# Patient Record
Sex: Male | Born: 1965 | Race: White | Hispanic: No | Marital: Married | Smoking: Current every day smoker
Health system: Southern US, Community
[De-identification: ages and names within clinical notes are randomized; demographics above are authoritative.]

## PROBLEM LIST (undated history)

## (undated) DIAGNOSIS — M25519 Pain in unspecified shoulder: Secondary | ICD-10-CM

## (undated) HISTORY — PX: KNEE SURGERY: SHX244

---

## 2000-04-03 ENCOUNTER — Ambulatory Visit (HOSPITAL_BASED_OUTPATIENT_CLINIC_OR_DEPARTMENT_OTHER): Admission: RE | Admit: 2000-04-03 | Discharge: 2000-04-03 | Payer: Self-pay | Admitting: Orthopedic Surgery

## 2005-12-15 ENCOUNTER — Emergency Department (HOSPITAL_COMMUNITY): Admission: EM | Admit: 2005-12-15 | Discharge: 2005-12-15 | Payer: Self-pay | Admitting: Emergency Medicine

## 2006-05-18 ENCOUNTER — Emergency Department: Payer: Self-pay

## 2009-03-09 ENCOUNTER — Ambulatory Visit: Payer: Self-pay | Admitting: Family Medicine

## 2009-06-28 ENCOUNTER — Ambulatory Visit: Payer: Self-pay | Admitting: Internal Medicine

## 2010-06-06 ENCOUNTER — Ambulatory Visit: Payer: Self-pay | Admitting: Internal Medicine

## 2011-06-03 ENCOUNTER — Ambulatory Visit: Payer: Self-pay

## 2012-02-25 ENCOUNTER — Ambulatory Visit: Payer: Self-pay

## 2012-02-25 ENCOUNTER — Observation Stay: Payer: Self-pay | Admitting: Internal Medicine

## 2012-02-25 LAB — CK TOTAL AND CKMB (NOT AT ARMC): CK-MB: 0.8 ng/mL (ref 0.5–3.6)

## 2012-02-25 LAB — URINALYSIS, COMPLETE
Blood: NEGATIVE
Ketone: NEGATIVE
Nitrite: NEGATIVE
Ph: 9 (ref 4.5–8.0)
Protein: NEGATIVE
Specific Gravity: 1.005 (ref 1.003–1.030)

## 2012-02-25 LAB — COMPREHENSIVE METABOLIC PANEL
Alkaline Phosphatase: 71 U/L (ref 50–136)
Calcium, Total: 8.5 mg/dL (ref 8.5–10.1)
Chloride: 112 mmol/L — ABNORMAL HIGH (ref 98–107)
Co2: 25 mmol/L (ref 21–32)
EGFR (Non-African Amer.): >60
Osmolality: 287 (ref 275–301)
Sodium: 145 mmol/L (ref 136–145)

## 2012-02-25 LAB — TROPONIN I
Troponin-I: <0.02 ng/mL
Troponin-I: <0.02 ng/mL

## 2012-02-25 LAB — CBC
HGB: 14.3 g/dL (ref 13.0–18.0)
MCV: 88 fL (ref 80–100)
Platelet: 200 10*3/uL (ref 150–440)
RBC: 4.63 10*6/uL (ref 4.40–5.90)
WBC: 4.9 10*3/uL (ref 3.8–10.6)

## 2012-02-26 LAB — TROPONIN I: Troponin-I: <0.02 ng/mL

## 2012-02-26 LAB — LIPID PANEL: HDL Cholesterol: 23 mg/dL — ABNORMAL LOW (ref 40–60)

## 2012-02-26 LAB — CBC WITH DIFFERENTIAL/PLATELET
Basophil #: 0 10*3/uL (ref 0.0–0.1)
Lymphocyte #: 2.7 10*3/uL (ref 1.0–3.6)
MCV: 89 fL (ref 80–100)
Monocyte %: 7.5 %
Platelet: 206 10*3/uL (ref 150–440)
RDW: 13.7 % (ref 11.5–14.5)
WBC: 7.2 10*3/uL (ref 3.8–10.6)

## 2012-04-21 ENCOUNTER — Ambulatory Visit: Payer: Self-pay | Admitting: General Practice

## 2013-05-21 IMAGING — CR DG CHEST 2V
1 series · 2 of 2 positions shown · non-contrast
Comparison: none

REASON FOR EXAM: chest pain
COMMENTS:   May transport without cardiac monitor

PROCEDURE:     DXR - DXR CHEST PA (OR AP) AND LATERAL  - February 25, 2012 [DATE]
RESULT:     Comparison: 06/06/2010

[Series 1: w chest pa · 0.14mm/px · 2 of 2 slices shown]
[im 1/2]
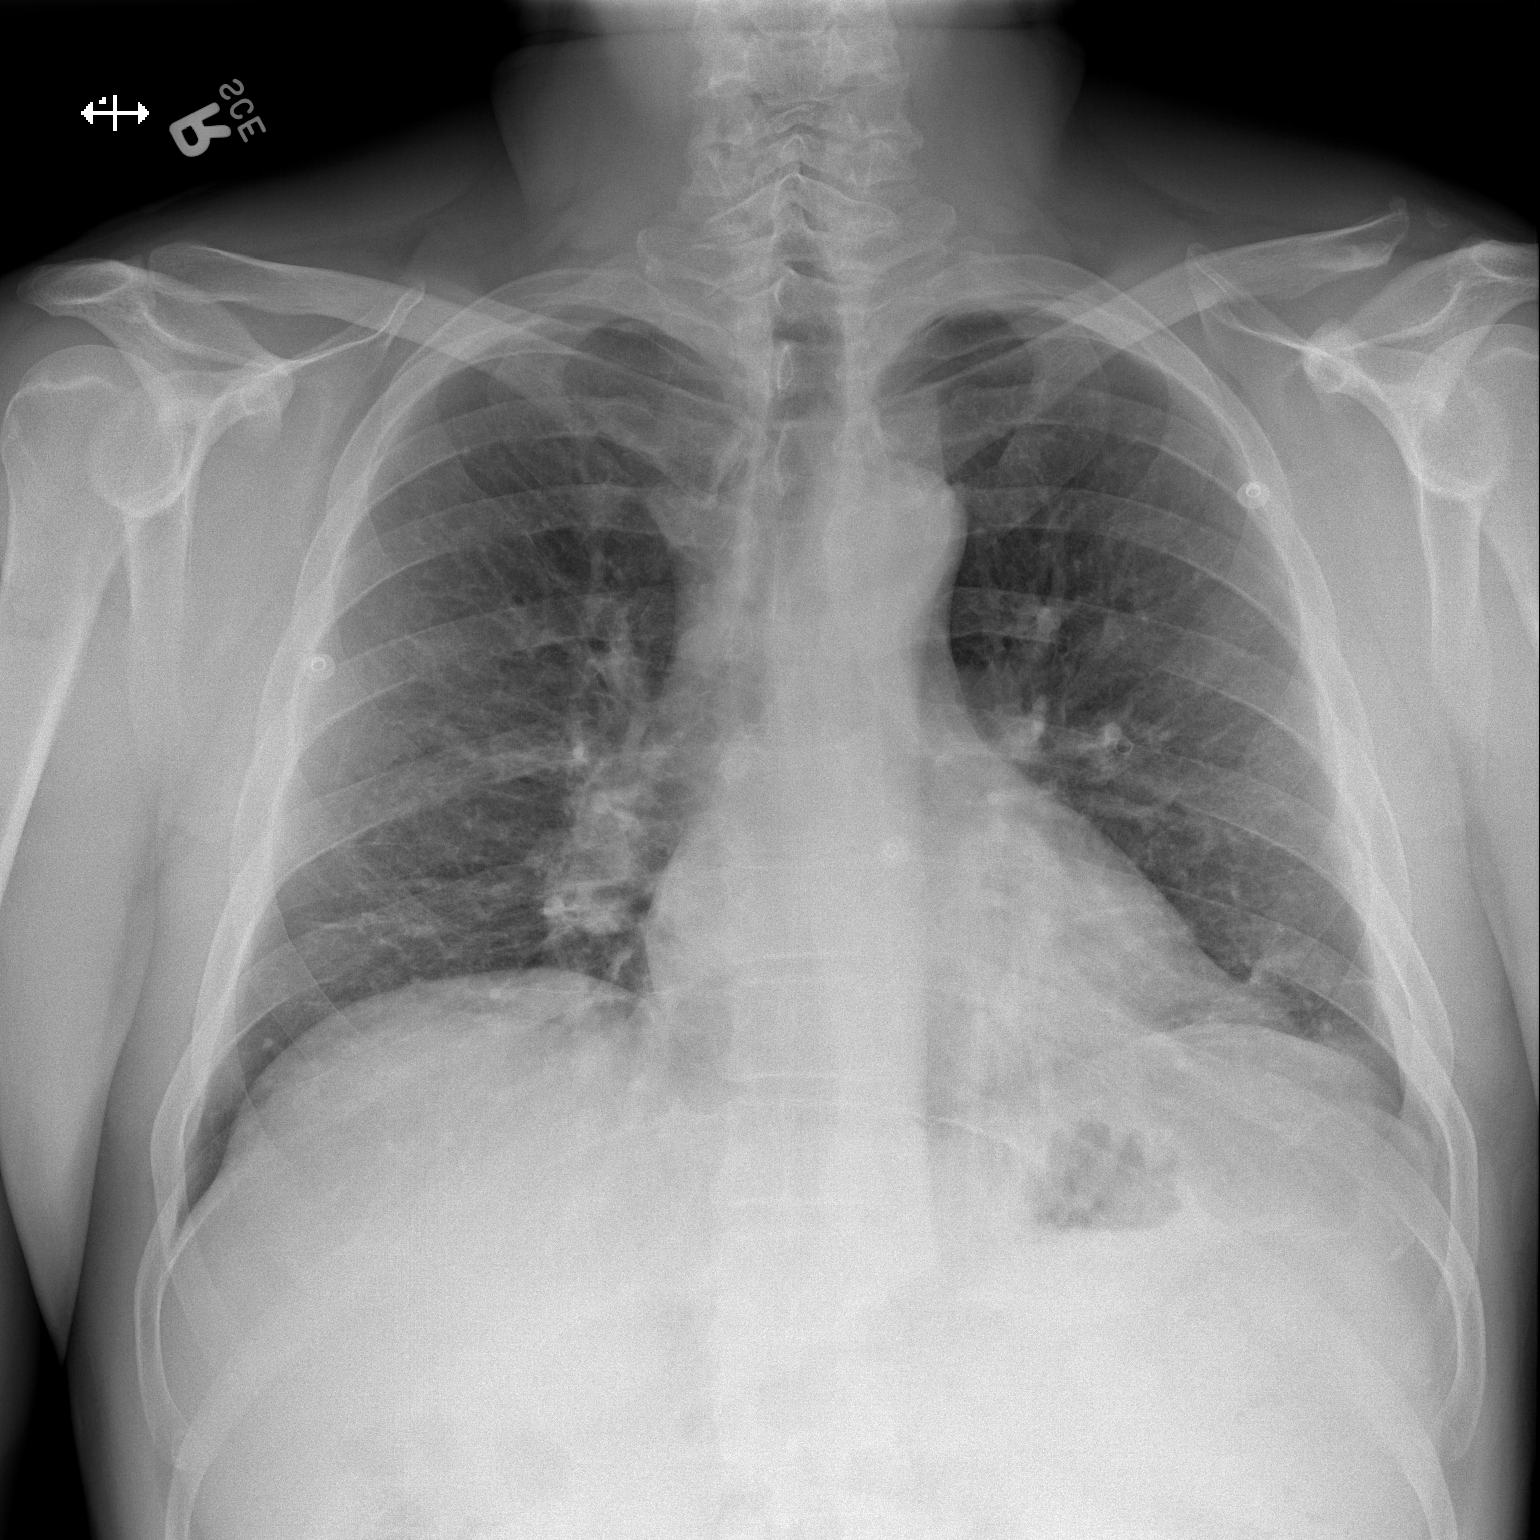
[im 2/2]
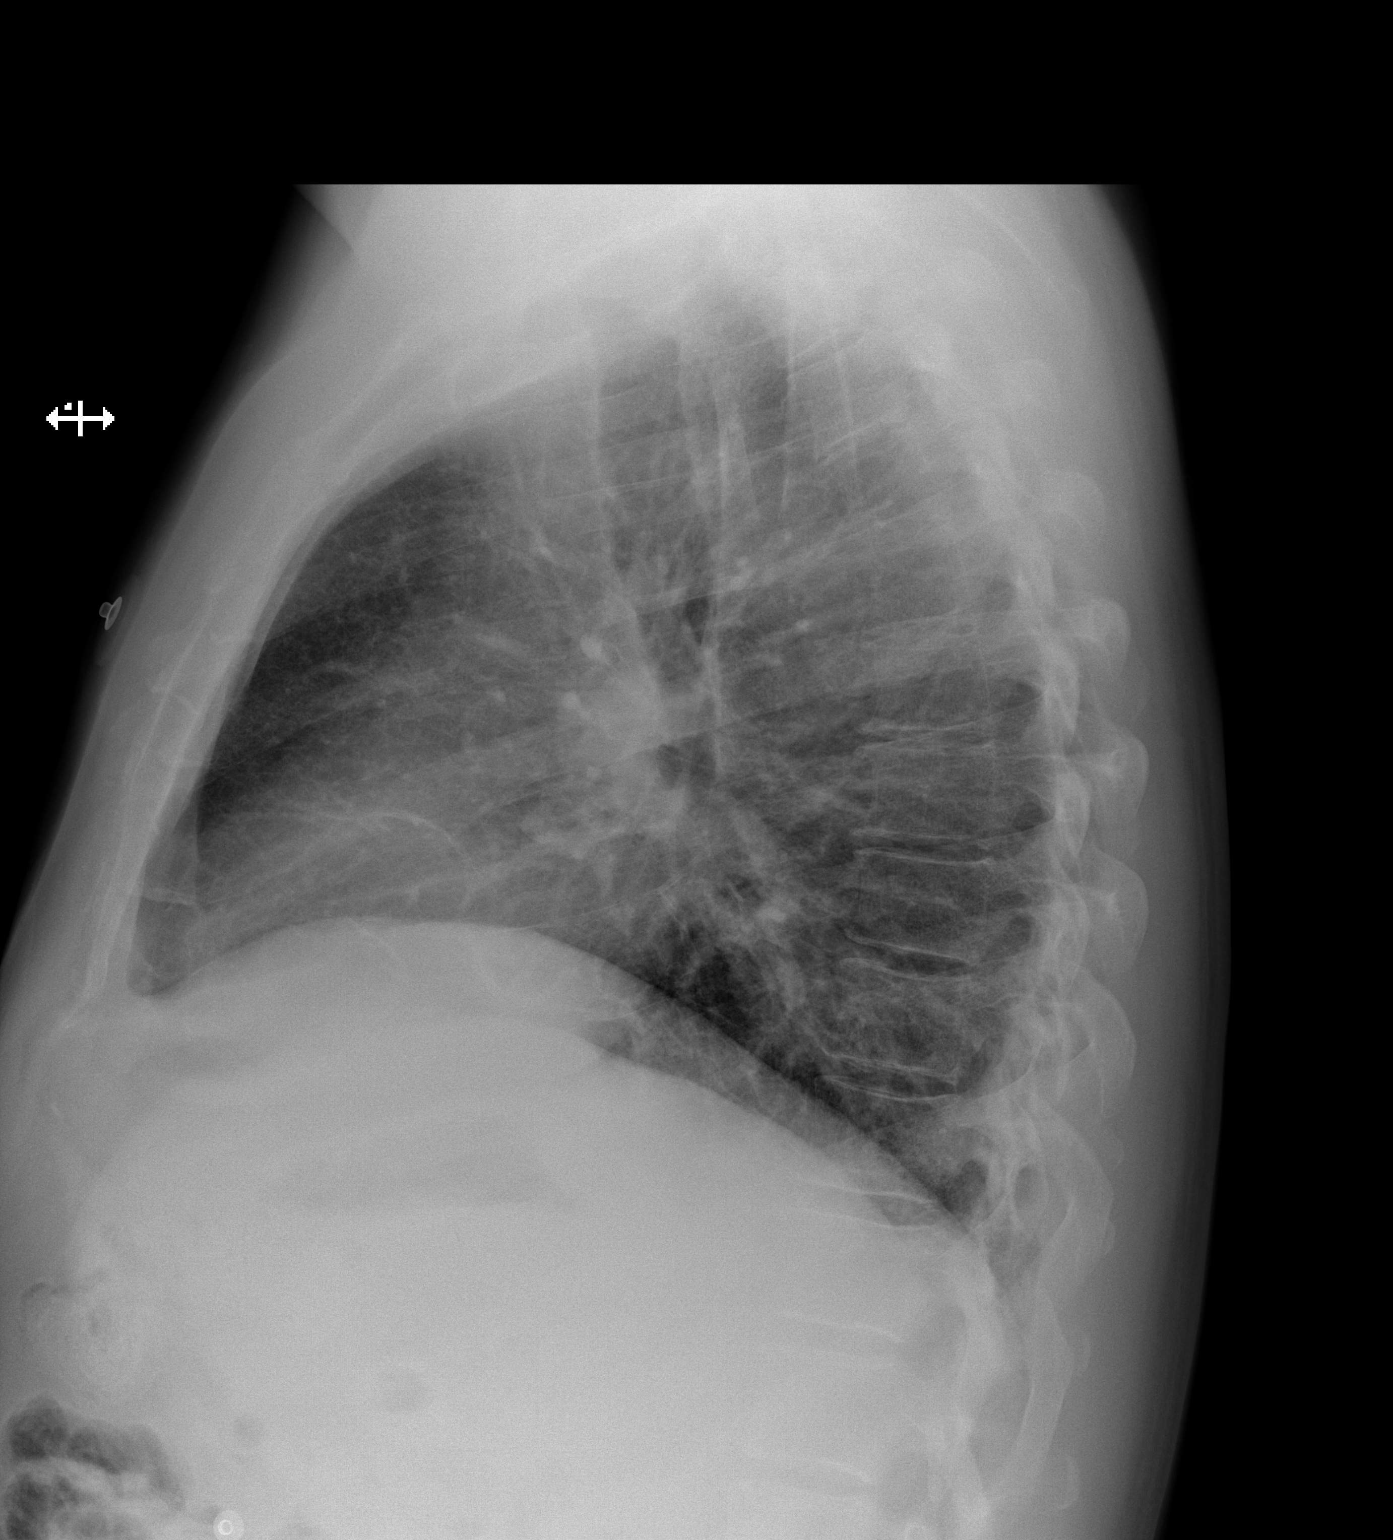

[2 of 2 positions shown; findings below may reference images not displayed]

FINDINGS: The heart and mediastinum are stable, given slightly diminished lung
volumes. Mild prominence of the right hilum is likely secondary to the low
lung volumes and vascular crowding. Mild left basilar opacities are likely
secondary to atelectasis.
IMPRESSION: 1. Mild left basilar opacities are likely secondary to atelectasis. However,
followup chest radiograph is recommended to ensure resolution.
2. Mild prominence of the right hilum is likely secondary to vascular
crowding from the low lung volumes. However, recommend attention on the
aforementioned followup chest radiograph and with improved inspiration.

[REDACTED]

## 2014-09-07 NOTE — Discharge Summary (Signed)
PATIENT NAME:  Kenneth AbideACOUCCI, Nainoa A MR#:  [id number] DATE OF BIRTH:  1965/09/20  DATE OF ADMISSION:  02/25/2012 DATE OF DISCHARGE:  02/26/2012  DISCHARGE DIAGNOSES:  1. Atypical chest pain, unclear etiology.  2. Hyperlipidemia.  3. Smoking.   DISPOSITION: The patient is being discharged home. Follow-up with primary care physician, Dr. Arlana Pouchate, in 1 to 2 weeks after discharge.   DIET: Low-fat, low-salt, low-cholesterol.  DISCHARGE MEDICATIONS: None.  RESULTS: The patient's stress test showed no evidence of ischemia, normal LV function.   Cardiac enzymes negative. CBC normal. VLDL 39, LDL 102, triglycerides 197, and HDL 23. Hemoglobin A1c 5.4.   HOSPITAL COURSE: The patient is a 49 year old male with history of smoking and hyperlipidemia, not on any medication, who presented with chest pain. Given his risk factors, he was admitted to the hospital and ruled out for acute coronary syndrome by checking serial cardiac enzymes, which were negative. The etiology of his chest pain was unclear, but his chest pain resolved spontaneously. He underwent an inpatient stress test which showed no evidence of ischemia. His LDL is 102. He was advised to diet/lifestyle modifications. He will address this with his PCP and start on treatment if needed. He was extensively counseled about smoking cessation. He is being discharged home in a stable condition.   TIME SPENT: 45 minutes.  ____________________________ Darrick MeigsSangeeta Shanora Christensen, MD sp:slb D: 02/26/2012 15:24:35 ET T: 02/27/2012 10:01:26 ET JOB#: [id number]  cc: Darrick MeigsSangeeta Dorinne Graeff, MD, <Dictator> Jillene Bucksenny C. Arlana Pouchate, MD Darrick MeigsSANGEETA Duana Benedict MD ELECTRONICALLY SIGNED 02/27/2012 14:18

## 2014-09-07 NOTE — Consult Note (Signed)
General Aspect 49 yo male with acute onset chest pain with associated dyspnea, diaphoresis this am relieved with  3 nitroglycerin. Ekg wtihout acute changes and cardiac enzymes normal. Small amount of pain early this pm with relief with one ntg. H/O of tobacco abuse x 33 years and hyperlidemia but not bad enough for medical management per pt. Now comfortable without complaints. Pt has also recently noted some fatigue with playing Paintball that never bothered him in past. Scheduled for stress test in am. No meds at home and no significant Past medical history other than stated above.No family h/o CAD. Bradycardia on monitor which is new for pt.   Physical Exam:   GEN well developed, no acute distress    HEENT pink conjunctivae    NECK supple    RESP normal resp effort  clear BS    CARD Bradycardic    ABD denies tenderness  normal BS    EXTR negative edema    SKIN normal to palpation    NEURO cranial nerves intact, motor/sensory function intact    PSYCH alert, A+O to time, place, person   Review of Systems:   Subjective/Chief Complaint Chest Pain    General: Fatigue    Skin: No Complaints    ENT: No Complaints    Eyes: No Complaints    Neck: No Complaints    Respiratory: Short of breath  this am    Cardiovascular: Chest pain or discomfort  this am    Gastrointestinal: No Complaints    Genitourinary: No Complaints    Vascular: No Complaints    Musculoskeletal: No Complaints    Neurologic: No Complaints    Hematologic: No Complaints    Endocrine: No Complaints    Psychiatric: No Complaints    Medications/Allergies Reviewed Medications/Allergies reviewed   Lab Results: Hepatic:  07-Oct-13 10:46    Bilirubin, Total 0.6   Alkaline Phosphatase 71   SGPT (ALT) 24   SGOT (AST) 25   Total Protein, Serum 6.9   Albumin, Serum 3.9  Routine Chem:  07-Oct-13 10:46    Glucose, Serum 83   BUN 11   Creatinine (comp) 1.06   Sodium, Serum 145   Potassium,  Serum 3.7   Chloride, Serum  112   CO2, Serum 25   Calcium (Total), Serum 8.5   Osmolality (calc) 287   eGFR (African American) >60   eGFR (Non-African American) >60 (eGFR values <33m/min/1.73 m2 may be an indication of chronic kidney disease (CKD). Calculated eGFR is useful in patients with stable renal function. The eGFR calculation will not be reliable in acutely ill patients when serum creatinine is changing rapidly. It is not useful in  patients on dialysis. The eGFR calculation may not be applicable to patients at the low and high extremes of body sizes, pregnant women, and vegetarians.)   Anion Gap 8  Cardiac:  07-Oct-13 10:46    CK, Total 131   CPK-MB, Serum 0.9 (Result(s) reported on 25 Feb 2012 at 11:37AM.)   Troponin I < 0.02 (0.00-0.05 0.05 ng/mL or less: NEGATIVE  Repeat testing in 3-6 hrs  if clinically indicated. >0.05 ng/mL: POTENTIAL  MYOCARDIAL INJURY. Repeat  testing in 3-6 hrs if  clinically indicated. NOTE: An increase or decrease  of 30% or more on serial  testing suggests a  clinically important change)   Radiology Results: XRay:    07-Oct-13 11:15, Chest PA and Lateral   Chest PA and Lateral    REASON FOR EXAM:  chest pain  COMMENTS:   May transport without cardiac monitor    PROCEDURE: DXR - DXR CHEST PA (OR AP) AND LATERAL  - Feb 25 2012 11:15AM     RESULT: Comparison: 06/06/2010    Findings:  The heart and mediastinum are stable, given slightly diminished lung   volumes. Mild prominence of the right hilum is likely secondary to the   low lung volumes and vascular crowding. Mild left basilar opacities are   likely secondary to atelectasis.    IMPRESSION:   1. Mild left basilar opacities are likely secondary to atelectasis.     However, followup chest radiograph is recommended to ensure resolution.  2. Mild prominence of the right hilum is likely secondary to vascular   crowding from the low lung volumes. However, recommend attention on  the   aforementioned followup chest radiograph and with improved inspiration.    Dictation site: 2          Verified By: Gregor Hams, M.D., MD    Bee Stings: Anaphylaxis  Vital Signs/Nurse's Notes: **Vital Signs.:   07-Oct-13 15:18   Temperature Temperature (F) 97.8   Celsius 36.5   Temperature Source Oral   Pulse Pulse 50   Respirations Respirations 18   Systolic BP Systolic BP 388   Diastolic BP (mmHg) Diastolic BP (mmHg) 77   Mean BP 90   Pulse Ox % Pulse Ox % 95   Pulse Ox Activity Level  At rest   Oxygen Delivery Room Air/ 21 %     Impression 49 yo male with chest pain, diaphoresis, dyspnea with negative EKG changes and first troponin negative. Scheduled for stress test in am but if other serial enzymes are showing any elevation, would consider cancelling stress test and going straight to cardiac catherization.   Electronic Signatures: Roderic Palau (NP)  (Signed 07-Oct-13 17:25)  Authored: General Aspect/Present Illness, History and Physical Exam, Review of System, Labs, Radiology, Allergies, Vital Signs/Nurse's Notes, Impression/Plan   Last Updated: 07-Oct-13 17:25 by Roderic Palau (NP)

## 2014-09-07 NOTE — Op Note (Signed)
PATIENT NAME:  Kenneth Ho, [name] MR#:  [id number] DATE OF BIRTH:  June 16, 1965  DATE OF PROCEDURE:  04/21/2012  PREOPERATIVE DIAGNOSIS: Left carpal tunnel syndrome.   POSTOPERATIVE DIAGNOSIS: Left carpal tunnel syndrome.   PROCEDURE PERFORMED: Left carpal tunnel release.   SURGEON: Illene LabradorJames P. Angie FavaHooten Jr., MD  ANESTHESIA: General.   ESTIMATED BLOOD LOSS: Minimal.   TOURNIQUET TIME: 29 minutes.   DRAINS: None.  ESTIMATED BLOOD LOSS: Minimal.   INDICATIONS FOR SURGERY: The patient is a 49 year old right-hand dominant male who had approximately one year history of progressive numbness and paresthesias to the left hand. Previous EMG nerve conduction studies demonstrated findings consistent with left carpal tunnel syndrome that was felt to be moderately severe. He did not see any significant improvement despite splinting and activity modification. After discussion of the risks and benefits of surgical intervention, the patient expressed understanding of the risks and benefits and agreed with plans for surgical intervention.   PROCEDURE IN DETAIL: Patient was brought into the Operating Room and, after adequate general anesthesia was achieved, a tourniquet was placed on the patient's upper left arm. Patient's left hand and arm were cleaned and prepped with alcohol and DuraPrep, draped in the usual sterile fashion. A "timeout" was performed as per usual protocol. The right upper extremity was exsanguinated using Esmarch, and tourniquet was inflated to 250 mmHg. Loupe magnification was used throughout the procedure. Curvilinear incision was made just ulnar to the thenar palmar crease. Dissection was carried down through the palmar fascia to the transverse carpal ligament. Transverse carpal ligament was sharply incised, taking care to protect the underlying structures within the carpal tunnel. Complete release of the transverse carpal ligament was achieved. There was evidence of flattening of the median nerve  under the extent of the transverse carpal ligament. No evidence of lipoma or ganglion cyst formation. The wound was irrigated with copious amounts of normal saline with antibiotic solution. The skin was reapproximated using interrupted sutures of 5-0 nylon. 10 mL of 0.25% Marcaine was injected along the incision site. A sterile dressing was applied followed by application of volar wrist splint. Tourniquet was deflated after total tourniquet time of 29 minutes.   Patient tolerated the procedure well. He was transported to the recovery room in stable condition.    ____________________________ Illene LabradorJames P. Angie FavaHooten Jr., MD jph:cms D: 04/21/2012 21:36:00 ET T: 04/22/2012 10:52:38 ET JOB#: [id number]  cc: Fayrene FearingJames P. Angie FavaHooten Jr., MD, <Dictator> JAMES P Angie FavaHOOTEN JR MD ELECTRONICALLY SIGNED 04/22/2012 21:14

## 2014-09-07 NOTE — H&P (Signed)
PATIENT NAME:  Kenneth Ho, Kenneth Ho MR#:  [id number] DATE OF BIRTH:  [birth year]  DATE OF ADMISSION:  02/25/2012  CHIEF COMPLAINT: Chest pain and pressure.   PRIMARY CARE PHYSICIAN: Dewaine Oats, MD  ER PHYSICIAN: Daryel November, MD  HISTORY OF PRESENT ILLNESS: This is a 49 year old male with history of hyperlipidemia, not taking any medication, brought by the wife. The patient was sent from [facility]. The patient had chest pain this morning and had shortness of breath. The patient started to have chest pain around 6:30, in the middle of the chest, radiating to the left arm, 10/10 in severity.  The patient went to work, but because of persistent chest pain with trouble breathing, the patient was taken to [facility] by his boss. The patient's chest pain was relieved with nitroglycerin in urgent care. He also got aspirin. He was sent here for further evaluation. The patient's chest pain was significant with heaviness, shortness of breath, and some nausea and sweating. It was stabbing type of pain relieved with nitroglycerin. The patient was evaluated here. The patient is referred for admission.   PAST MEDICAL HISTORY: No hypertension, but his wife says he has hyperlipidemia, but not on medicine.   ALLERGIES: Bee stings.   SOCIAL HISTORY: Heavy smoker before, now cut down to one pack every two days and using electronic cigarettes. Occasional alcohol but no drugs. The patient works at [employer] as a Merchandiser, retail.   MEDICATIONS: None.   FAMILY HISTORY: Significant for diabetes in his father.  PAST SURGICAL HISTORY:  1. Appendectomy. 2. Carpal tunnel release surgery. 3. Both knee surgery.  REVIEW OF SYSTEMS: CONSTITUTIONAL: Denies fever. No fatigue. EYES: No blurred vision. ENT: No tinnitus. No epistaxis. No difficulty swallowing. RESPIRATORY: Shortness of breath and chest pain today. CARDIOVASCULAR: Chest pain today with some heaviness in the chest and shortness of breath and nausea.  Symptoms were relieved with nitroglycerin. GASTROINTESTINAL: No abdominal pain. No hematemesis. No constipation or diarrhea. GENITOURINARY: No dysuria. INTEGUMENTARY: No skin rashes. MUSCULOSKELETAL: No joint pain. NEUROLOGIC: No numbness or weakness. No transient ischemic attacks. PSYCH: No anxiety or insomnia.   PHYSICAL EXAMINATION:   GENERAL: A moderately obese male, not in distress, answering questions appropriately. Alert, awake, and oriented.   VITAL SIGNS: Temperature 98.2, pulse 52, respirations 18, blood pressure 108/71, and saturation 94% on room air.   HEAD/EYES: Head atraumatic, normocephalic. Pupils equally reacting to light. Extraocular movements intact.   ENT: No tympanic membrane congestion. No turbinate hypertrophy. The patient has no tonsillar exudate. No oropharyngeal erythema.   NECK: Normal range of motion. No JVD. No carotid bruit.   CARDIOVASCULAR: S1 and S2 regular. No murmurs.   PULMONARY: Clear to auscultation. No wheeze. No rales.   ABDOMEN: Soft. Bowel sounds present. No organomegaly.   EXTREMITIES: No extremity edema. No cyanosis. No clubbing.   NEUROLOGIC: Alert, awake, and oriented. Cranial nerves II through XII are intact. Power 5/5 in upper and lower extremities. Sensation intact. Deep tendon reflexes 2+ bilaterally.   PSYCH: Mood and affect are within normal limits.   LABS/RADIOLOGIC STUDIES: Chest x-ray: Mild left basilar opacity likely secondary to atelectasis. Mild prominence in the right hilum likely secondary to vascular crowding from low lung volumes. Recommend attention on the aforementioned followup chest radiograph with improved inspiration.   Electrolytes: Sodium 145, potassium 3.7, chloride 112, bicarbonate 25, BUN 11, creatinine 1.06, and glucose 83. CK total 131. CPK-MB 0.9. CBC: WBC 4.9, hemoglobin 14.3, hematocrit 40.7, and platelets 200. Troponin less than  0.02.  Urinalysis: Straw-colored, less than 1 bacteria.   EKG: Sinus  bradycardia with no ST-T changes, 54 beats per minute.   ASSESSMENT AND PLAN:  1. The patient is a 49 year old male with chest pain. Symptoms are concerning for angina. The patient had chest pain relieved with nitroglycerin given in urgent care, also in the ER. So the patient will be admitted for overnight observation for unstable angina. Continue to monitor him on telemetry, continue aspirin, nitroglycerin and also Lovenox and statins. Beta blockers cannot be given because he is having bradycardia, heart rate is around 50s.  Dr. Darrold JunkerParaschos already saw the patient. The patient will be having stress test tomorrow. Obtain fasting lipase also as well. Continue to cycle cardiac markers.  2. Tobacco abuse. The patient has atelectasis on the left side. We will repeat another x-ray tomorrow to make sure it is resolving. Also counseled about smoking cessation for three minutes. The patient's wife says he cut down a lot, from two packs a day to almost one pack every two days and using electronic cigarettes so he is on the way to quitting and appreciate that.  TIME SPENT: About 55 minutes. ____________________________ Katha HammingSnehalatha Tyniya Kuyper, MD sk:slb D: 02/25/2012 13:14:00 ET     T: 02/25/2012 14:02:51 ET        JOB#: [id number] cc: Katha HammingSnehalatha Samuel Rittenhouse, MD, <Dictator> Jillene Bucksenny C. Arlana Pouchate, MD Katha HammingSNEHALATHA Garrie Woodin MD ELECTRONICALLY SIGNED 03/12/2012 13:53

## 2018-12-11 ENCOUNTER — Other Ambulatory Visit: Payer: Self-pay | Admitting: Physician Assistant

## 2018-12-11 DIAGNOSIS — M50123 Cervical disc disorder at C6-C7 level with radiculopathy: Secondary | ICD-10-CM

## 2019-03-23 ENCOUNTER — Other Ambulatory Visit: Payer: Self-pay | Admitting: Surgery

## 2019-03-27 ENCOUNTER — Other Ambulatory Visit: Payer: Self-pay

## 2019-03-27 ENCOUNTER — Encounter
Admission: RE | Admit: 2019-03-27 | Discharge: 2019-03-27 | Disposition: A | Discharge status: Home / Self Care | Payer: BC Managed Care – PPO | Source: Ambulatory Visit | Attending: Surgery | Admitting: Surgery

## 2019-03-27 HISTORY — DX: Pain in unspecified shoulder: M25.519

## 2019-03-27 NOTE — Patient Instructions (Signed)
Your procedure is scheduled on: 04/02/2019 Thurs Report to Same Day Surgery 2nd floor medical mall [facility] Entrance-take elevator on left to 2nd floor.  Check in with surgery information desk.) To find out your arrival time please call [phone] between 1PM - 3PM on 04/01/2019 Wed  Remember: Instructions that are not followed completely may result in serious medical risk, up to and including death, or upon the discretion of your surgeon and anesthesiologist your surgery may need to be rescheduled.    _x___ 1. Do not eat food after midnight the night before your procedure. You may drink clear liquids up to 2 hours before you are scheduled to arrive at the hospital for your procedure.  Do not drink clear liquids within 2 hours of your scheduled arrival to the hospital.  Clear liquids include  --Water or Apple juice without pulp  --Clear carbohydrate beverage such as ClearFast or Gatorade  --Black Coffee or Clear Tea (No milk, no creamers, do not add anything to                  the coffee or Tea Type 1 and type 2 diabetics should only drink water.   ____Ensure clear carbohydrate drink on the way to the hospital for bariatric patients  _x___Ensure clear carbohydrate drink 3 hours before surgery. Complete drink 2 hours before coming to the hospital   No gum chewing or hard candies.     __x__ 2. No Alcohol for 24 hours before or after surgery.   __x__3. No Smoking or e-cigarettes for 24 prior to surgery.  Do not use any chewable tobacco products for at least 6 hour prior to surgery   ____  4. Bring all medications with you on the day of surgery if instructed.    __x__ 5. Notify your doctor if there is any change in your medical condition     (cold, fever, infections).    x___6. On the morning of surgery brush your teeth with toothpaste and water.  You may rinse your mouth with mouth wash if you wish.  Do not swallow any toothpaste or mouthwash.   Do not wear jewelry, make-up,  hairpins, clips or nail polish.  Do not wear lotions, powders, or perfumes. You may wear deodorant.  Do not shave 48 hours prior to surgery. Men may shave face and neck.  Do not bring valuables to the hospital.    [facility] is not responsible for any belongings or valuables.               Contacts, dentures or bridgework may not be worn into surgery.  Leave your suitcase in the car. After surgery it may be brought to your room.  For patients admitted to the hospital, discharge time is determined by your                       treatment team.  _  Patients discharged the day of surgery will not be allowed to drive home.  You will need someone to drive you home and stay with you the night of your procedure.    Please read over the following fact sheets that you were given:   [facility] Preparing for Surgery and or MRSA Information   _x___ Take anti-hypertensive listed below, cardiac, seizure, asthma,     anti-reflux and psychiatric medicines. These include:  1. None  2.  3.  4.  5.  6.  ____Fleets enema or Magnesium Citrate  as directed.   _x___ Use CHG Soap or sage wipes as directed on instruction sheet   ____ Use inhalers on the day of surgery and bring to hospital day of surgery  ____ Stop Metformin and Janumet 2 days prior to surgery.    ____ Take 1/2 of usual insulin dose the night before surgery and none on the morning     surgery.   _x___ Follow recommendations from Cardiologist, Pulmonologist or PCP regarding          stopping Aspirin, Coumadin, Plavix ,Eliquis, Effient, or Pradaxa, and Pletal.  X____Stop Anti-inflammatories such as Advil, Aleve, Ibuprofen, Motrin, Naproxen, Naprosyn, Goodies powders or aspirin products. OK to take Tylenol and                          Celebrex.   _x___ Stop supplements until after surgery.  But may continue Vitamin D, Vitamin B,       and multivitamin.   ____ Bring C-Pap to the hospital.

## 2019-03-27 NOTE — Pre-Procedure Instructions (Signed)
Incentive Spirometry and carbohydrate drink and instructions given.

## 2019-03-30 ENCOUNTER — Other Ambulatory Visit
Admission: RE | Admit: 2019-03-30 | Discharge: 2019-03-30 | Disposition: A | Discharge status: Home / Self Care | Payer: BC Managed Care – PPO | Source: Ambulatory Visit | Attending: Surgery | Admitting: Surgery

## 2019-03-30 ENCOUNTER — Other Ambulatory Visit: Payer: Self-pay

## 2019-03-30 DIAGNOSIS — Z20828 Contact with and (suspected) exposure to other viral communicable diseases: Secondary | ICD-10-CM | POA: Insufficient documentation

## 2019-03-30 DIAGNOSIS — Z01812 Encounter for preprocedural laboratory examination: Secondary | ICD-10-CM | POA: Insufficient documentation

## 2019-03-30 LAB — SARS CORONAVIRUS 2 (TAT 6-24 HRS): SARS Coronavirus 2: NEGATIVE

## 2019-04-02 ENCOUNTER — Ambulatory Visit
Admission: RE | Admit: 2019-04-02 | Discharge: 2019-04-02 | Disposition: A | Discharge status: Home / Self Care | Payer: BC Managed Care – PPO | Attending: Surgery | Admitting: Surgery

## 2019-04-02 ENCOUNTER — Other Ambulatory Visit: Payer: Self-pay

## 2019-04-02 ENCOUNTER — Encounter: Payer: Self-pay | Admitting: Emergency Medicine

## 2019-04-02 ENCOUNTER — Ambulatory Visit: Payer: BC Managed Care – PPO | Admitting: Anesthesiology

## 2019-04-02 ENCOUNTER — Ambulatory Visit: Payer: BC Managed Care – PPO

## 2019-04-02 ENCOUNTER — Encounter
Admission: RE | Disposition: A | Discharge status: Home / Self Care | Payer: Self-pay | Source: Home / Self Care | Attending: Surgery

## 2019-04-02 DIAGNOSIS — F17200 Nicotine dependence, unspecified, uncomplicated: Secondary | ICD-10-CM | POA: Insufficient documentation

## 2019-04-02 DIAGNOSIS — M7521 Bicipital tendinitis, right shoulder: Secondary | ICD-10-CM | POA: Insufficient documentation

## 2019-04-02 DIAGNOSIS — M75111 Incomplete rotator cuff tear or rupture of right shoulder, not specified as traumatic: Secondary | ICD-10-CM | POA: Insufficient documentation

## 2019-04-02 DIAGNOSIS — M25811 Other specified joint disorders, right shoulder: Secondary | ICD-10-CM | POA: Insufficient documentation

## 2019-04-02 DIAGNOSIS — Z79899 Other long term (current) drug therapy: Secondary | ICD-10-CM | POA: Diagnosis not present

## 2019-04-02 DIAGNOSIS — G4730 Sleep apnea, unspecified: Secondary | ICD-10-CM | POA: Insufficient documentation

## 2019-04-02 DIAGNOSIS — K219 Gastro-esophageal reflux disease without esophagitis: Secondary | ICD-10-CM | POA: Diagnosis not present

## 2019-04-02 DIAGNOSIS — Z791 Long term (current) use of non-steroidal anti-inflammatories (NSAID): Secondary | ICD-10-CM | POA: Insufficient documentation

## 2019-04-02 HISTORY — PX: SHOULDER ARTHROSCOPY WITH SUBACROMIAL DECOMPRESSION AND OPEN ROTATOR C: SHX5688

## 2019-04-02 SURGERY — SHOULDER ARTHROSCOPY WITH SUBACROMIAL DECOMPRESSION AND OPEN ROTATOR CUFF REPAIR, OPEN BICEPS TENDON REPAIR
Anesthesia: General | Site: Shoulder | Laterality: Right

## 2019-04-02 MED ORDER — ONDANSETRON HCL 4 MG/2ML IJ SOLN
INTRAMUSCULAR | Status: AC
  Filled 2019-04-02: qty 2

## 2019-04-02 MED ORDER — OXYCODONE HCL 5 MG/5ML PO SOLN: 5.0000 mg | Freq: Once | ORAL | Status: DC | PRN

## 2019-04-02 MED ORDER — LACTATED RINGERS IV SOLN
INTRAVENOUS | Status: DC
  Administered 2019-04-02: 08:00:00 via INTRAVENOUS

## 2019-04-02 MED ORDER — EPHEDRINE SULFATE 50 MG/ML IJ SOLN
INTRAMUSCULAR | Status: AC
  Filled 2019-04-02: qty 1

## 2019-04-02 MED ORDER — BUPIVACAINE HCL (PF) 0.5 % IJ SOLN
INTRAMUSCULAR | Status: AC
  Filled 2019-04-02: qty 30

## 2019-04-02 MED ORDER — SUGAMMADEX SODIUM 500 MG/5ML IV SOLN
INTRAVENOUS | Status: DC | PRN
  Administered 2019-04-02: 350 mg via INTRAVENOUS

## 2019-04-02 MED ORDER — ROCURONIUM BROMIDE 100 MG/10ML IV SOLN
INTRAVENOUS | Status: DC | PRN
  Administered 2019-04-02: 20 mg via INTRAVENOUS
  Administered 2019-04-02: 50 mg via INTRAVENOUS

## 2019-04-02 MED ORDER — ROCURONIUM BROMIDE 50 MG/5ML IV SOLN
INTRAVENOUS | Status: AC
  Filled 2019-04-02: qty 1

## 2019-04-02 MED ORDER — FENTANYL CITRATE (PF) 250 MCG/5ML IJ SOLN
INTRAMUSCULAR | Status: DC | PRN
  Administered 2019-04-02: 100 ug via INTRAVENOUS

## 2019-04-02 MED ORDER — FAMOTIDINE 20 MG PO TABS
20.0000 mg | ORAL_TABLET | Freq: Once | ORAL | Status: AC
  Administered 2019-04-02: 08:00:00 20 mg via ORAL

## 2019-04-02 MED ORDER — ACETAMINOPHEN 10 MG/ML IV SOLN
INTRAVENOUS | Status: AC
  Filled 2019-04-02: qty 100

## 2019-04-02 MED ORDER — SUGAMMADEX SODIUM 200 MG/2ML IV SOLN
INTRAVENOUS | Status: AC
  Filled 2019-04-02: qty 2

## 2019-04-02 MED ORDER — ACETAMINOPHEN 10 MG/ML IV SOLN
INTRAVENOUS | Status: DC | PRN
  Administered 2019-04-02: 1000 mg via INTRAVENOUS

## 2019-04-02 MED ORDER — PHENYLEPHRINE HCL (PRESSORS) 10 MG/ML IV SOLN
INTRAVENOUS | Status: DC | PRN
  Administered 2019-04-02 (×3): 100 ug via INTRAVENOUS

## 2019-04-02 MED ORDER — OXYCODONE HCL 5 MG PO TABS: 5.0000 mg | ORAL_TABLET | ORAL | 0 refills | Status: DC | PRN

## 2019-04-02 MED ORDER — FENTANYL CITRATE (PF) 100 MCG/2ML IJ SOLN
INTRAMUSCULAR | Status: AC
  Administered 2019-04-02: 50 ug via INTRAVENOUS
  Filled 2019-04-02: qty 2

## 2019-04-02 MED ORDER — BUPIVACAINE LIPOSOME 1.3 % IJ SUSP
INTRAMUSCULAR | Status: AC
  Filled 2019-04-02: qty 20

## 2019-04-02 MED ORDER — BUPIVACAINE HCL (PF) 0.5 % IJ SOLN
INTRAMUSCULAR | Status: DC | PRN
  Administered 2019-04-02: 7 mL via PERINEURAL
  Administered 2019-04-02: 3 mL via PERINEURAL

## 2019-04-02 MED ORDER — CEFAZOLIN SODIUM-DEXTROSE 2-4 GM/100ML-% IV SOLN
INTRAVENOUS | Status: AC
  Filled 2019-04-02: qty 100

## 2019-04-02 MED ORDER — FAMOTIDINE 20 MG PO TABS
ORAL_TABLET | ORAL | Status: AC
  Administered 2019-04-02: 20 mg via ORAL
  Filled 2019-04-02: qty 1

## 2019-04-02 MED ORDER — LIDOCAINE HCL (PF) 2 % IJ SOLN
INTRAMUSCULAR | Status: AC
  Filled 2019-04-02: qty 10

## 2019-04-02 MED ORDER — PHENYLEPHRINE HCL (PRESSORS) 10 MG/ML IV SOLN
INTRAVENOUS | Status: AC
  Filled 2019-04-02: qty 1

## 2019-04-02 MED ORDER — OXYCODONE HCL 5 MG PO TABS: 5.0000 mg | ORAL_TABLET | Freq: Once | ORAL | Status: DC | PRN

## 2019-04-02 MED ORDER — DEXAMETHASONE SODIUM PHOSPHATE 10 MG/ML IJ SOLN
INTRAMUSCULAR | Status: DC | PRN
  Administered 2019-04-02: 10 mg via INTRAVENOUS

## 2019-04-02 MED ORDER — BUPIVACAINE LIPOSOME 1.3 % IJ SUSP
INTRAMUSCULAR | Status: DC | PRN
  Administered 2019-04-02: 13 mL via PERINEURAL
  Administered 2019-04-02: 7 mL via PERINEURAL

## 2019-04-02 MED ORDER — FENTANYL CITRATE (PF) 100 MCG/2ML IJ SOLN: 25.0000 ug | INTRAMUSCULAR | Status: DC | PRN

## 2019-04-02 MED ORDER — EPHEDRINE SULFATE 50 MG/ML IJ SOLN
INTRAMUSCULAR | Status: DC | PRN
  Administered 2019-04-02 (×2): 10 mg via INTRAVENOUS

## 2019-04-02 MED ORDER — LIDOCAINE HCL (PF) 1 % IJ SOLN
INTRAMUSCULAR | Status: AC
  Filled 2019-04-02: qty 5

## 2019-04-02 MED ORDER — LIDOCAINE HCL (CARDIAC) PF 100 MG/5ML IV SOSY
PREFILLED_SYRINGE | INTRAVENOUS | Status: DC | PRN
  Administered 2019-04-02: 60 mg via INTRATRACHEAL

## 2019-04-02 MED ORDER — PROPOFOL 10 MG/ML IV BOLUS
INTRAVENOUS | Status: DC | PRN
  Administered 2019-04-02: 130 mg via INTRAVENOUS

## 2019-04-02 MED ORDER — ONDANSETRON HCL 4 MG/2ML IJ SOLN
INTRAMUSCULAR | Status: DC | PRN
  Administered 2019-04-02: 4 mg via INTRAVENOUS

## 2019-04-02 MED ORDER — SUGAMMADEX SODIUM 500 MG/5ML IV SOLN
INTRAVENOUS | Status: AC
  Filled 2019-04-02: qty 5

## 2019-04-02 MED ORDER — MIDAZOLAM HCL 2 MG/2ML IJ SOLN
1.0000 mg | Freq: Once | INTRAMUSCULAR | Status: AC
  Administered 2019-04-02: 08:00:00 1 mg via INTRAVENOUS

## 2019-04-02 MED ORDER — DEXAMETHASONE SODIUM PHOSPHATE 10 MG/ML IJ SOLN
INTRAMUSCULAR | Status: AC
  Filled 2019-04-02: qty 1

## 2019-04-02 MED ORDER — EPINEPHRINE PF 1 MG/ML IJ SOLN
INTRAMUSCULAR | Status: AC
  Filled 2019-04-02: qty 3

## 2019-04-02 MED ORDER — BUPIVACAINE HCL (PF) 0.5 % IJ SOLN
INTRAMUSCULAR | Status: AC
  Filled 2019-04-02: qty 10

## 2019-04-02 MED ORDER — LIDOCAINE HCL 4 % MT SOLN
OROMUCOSAL | Status: DC | PRN
  Administered 2019-04-02: 4 mL via TOPICAL

## 2019-04-02 MED ORDER — MIDAZOLAM HCL 2 MG/2ML IJ SOLN
INTRAMUSCULAR | Status: AC
  Administered 2019-04-02: 1 mg via INTRAVENOUS
  Filled 2019-04-02: qty 2

## 2019-04-02 MED ORDER — BUPIVACAINE-EPINEPHRINE 0.5% -1:200000 IJ SOLN
INTRAMUSCULAR | Status: DC | PRN
  Administered 2019-04-02: 30 mL

## 2019-04-02 MED ORDER — MIDAZOLAM HCL 2 MG/2ML IJ SOLN
INTRAMUSCULAR | Status: DC | PRN
  Administered 2019-04-02: 2 mg via INTRAVENOUS

## 2019-04-02 MED ORDER — CHLORHEXIDINE GLUCONATE 4 % EX LIQD: 60.0000 mL | Freq: Once | CUTANEOUS | Status: DC

## 2019-04-02 MED ORDER — MIDAZOLAM HCL 2 MG/2ML IJ SOLN
INTRAMUSCULAR | Status: AC
  Filled 2019-04-02: qty 2

## 2019-04-02 MED ORDER — CEFAZOLIN SODIUM-DEXTROSE 2-4 GM/100ML-% IV SOLN
2.0000 g | INTRAVENOUS | Status: AC
  Administered 2019-04-02: 2 g via INTRAVENOUS

## 2019-04-02 MED ORDER — FENTANYL CITRATE (PF) 100 MCG/2ML IJ SOLN
50.0000 ug | Freq: Once | INTRAMUSCULAR | Status: AC
  Administered 2019-04-02: 08:00:00 50 ug via INTRAVENOUS

## 2019-04-02 MED ORDER — FENTANYL CITRATE (PF) 100 MCG/2ML IJ SOLN
INTRAMUSCULAR | Status: AC
  Filled 2019-04-02: qty 2

## 2019-04-02 MED ORDER — PROPOFOL 10 MG/ML IV BOLUS
INTRAVENOUS | Status: AC
  Filled 2019-04-02: qty 20

## 2019-04-02 SURGICAL SUPPLY — 50 items
ANCHOR BONE REGENETEN (Anchor) ×2 IMPLANT
ANCHOR JUGGERKNOT WTAP NDL 2.9 (Anchor) ×1 IMPLANT
ANCHOR TENDON REGENETEN (Staple) ×1 IMPLANT
BIT DRILL JUGRKNT W/NDL BIT2.9 (DRILL) IMPLANT
BLADE FULL RADIUS 3.5 (BLADE) ×2 IMPLANT
BUR ACROMIONIZER 4.0 (BURR) ×2 IMPLANT
CANNULA SHAVER 8MMX76MM (CANNULA) ×2 IMPLANT
CHLORAPREP W/TINT 26 (MISCELLANEOUS) ×2 IMPLANT
COVER MAYO STAND REUSABLE (DRAPES) ×2 IMPLANT
COVER WAND RF STERILE (DRAPES) ×2 IMPLANT
DRAPE IMP U-DRAPE 54X76 (DRAPES) ×4 IMPLANT
DRAPE SPLIT 6X30 W/TAPE (DRAPES) ×4 IMPLANT
DRILL JUGGERKNOT W/NDL BIT 2.9 (DRILL) ×2
ELECT CAUTERY BLADE TIP 2.5 (TIP) ×2
ELECT REM PT RETURN 9FT ADLT (ELECTROSURGICAL) ×2
ELECTRODE CAUTERY BLDE TIP 2.5 (TIP) ×1 IMPLANT
ELECTRODE REM PT RTRN 9FT ADLT (ELECTROSURGICAL) ×1 IMPLANT
GAUZE SPONGE 4X4 12PLY STRL (GAUZE/BANDAGES/DRESSINGS) ×2 IMPLANT
GAUZE XEROFORM 1X8 LF (GAUZE/BANDAGES/DRESSINGS) ×2 IMPLANT
GLOVE BIO SURGEON STRL SZ7.5 (GLOVE) ×4 IMPLANT
GLOVE BIO SURGEON STRL SZ8 (GLOVE) ×4 IMPLANT
GLOVE BIOGEL PI IND STRL 8 (GLOVE) ×1 IMPLANT
GLOVE BIOGEL PI INDICATOR 8 (GLOVE) ×1
GLOVE INDICATOR 8.0 STRL GRN (GLOVE) ×2 IMPLANT
GOWN STRL REUS W/ TWL LRG LVL3 (GOWN DISPOSABLE) ×1 IMPLANT
GOWN STRL REUS W/ TWL XL LVL3 (GOWN DISPOSABLE) ×1 IMPLANT
GOWN STRL REUS W/TWL LRG LVL3 (GOWN DISPOSABLE) ×1
GOWN STRL REUS W/TWL XL LVL3 (GOWN DISPOSABLE) ×1
GRASPER SUT 15 45D LOW PRO (SUTURE) IMPLANT
IMPL REGENETEN LRG (Shoulder) IMPLANT
IMPLANT REGENETEN LRG (Shoulder) ×2 IMPLANT
IV LACTATED RINGER IRRG 3000ML (IV SOLUTION) ×1
IV LR IRRIG 3000ML ARTHROMATIC (IV SOLUTION) ×2 IMPLANT
MANIFOLD NEPTUNE II (INSTRUMENTS) ×2 IMPLANT
MASK FACE SPIDER DISP (MASK) ×2 IMPLANT
MAT ABSORB  FLUID 56X50 GRAY (MISCELLANEOUS) ×1
MAT ABSORB FLUID 56X50 GRAY (MISCELLANEOUS) ×1 IMPLANT
PACK ARTHROSCOPY SHOULDER (MISCELLANEOUS) ×2 IMPLANT
PASSER SUT FIRSTPASS SELF (INSTRUMENTS) IMPLANT
SLING ARM LRG DEEP (SOFTGOODS) ×2 IMPLANT
SLING ULTRA II LG (MISCELLANEOUS) ×1 IMPLANT
STAPLER SKIN PROX 35W (STAPLE) ×2 IMPLANT
STRAP SAFETY 5IN WIDE (MISCELLANEOUS) ×2 IMPLANT
SUT ETHIBOND 0 MO6 C/R (SUTURE) ×2 IMPLANT
SUT VIC AB 2-0 CT1 27 (SUTURE) ×2
SUT VIC AB 2-0 CT1 TAPERPNT 27 (SUTURE) ×2 IMPLANT
TAPE MICROFOAM 4IN (TAPE) ×2 IMPLANT
TUBING ARTHRO INFLOW-ONLY STRL (TUBING) ×2 IMPLANT
TUBING CONNECTING 10 (TUBING) ×2 IMPLANT
WAND WEREWOLF FLOW 90D (MISCELLANEOUS) ×2 IMPLANT

## 2019-04-02 NOTE — Anesthesia Preprocedure Evaluation (Signed)
Anesthesia Evaluation  Patient identified by MRN, date of birth, ID band Patient awake    Reviewed: Allergy & Precautions, H&P , NPO status , Patient's Chart, lab work & pertinent test results  History of Anesthesia Complications Negative for: history of anesthetic complications  Airway Mallampati: II  TM Distance: >3 FB Neck ROM: full    Dental  (+) Poor Dentition, Missing, Lower Dentures, Upper Dentures   Pulmonary neg shortness of breath, sleep apnea , Current Smoker and Patient abstained from smoking.,           Cardiovascular Exercise Tolerance: Good (-) angina(-) Past MI and (-) DOE negative cardio ROS       Neuro/Psych negative neurological ROS  negative psych ROS   GI/Hepatic negative GI ROS, Neg liver ROS, neg GERD  ,  Endo/Other  negative endocrine ROS  Renal/GU      Musculoskeletal   Abdominal   Peds  Hematology negative hematology ROS (+)   Anesthesia Other Findings Past Medical History: No date: Shoulder pain  Past Surgical History: No date: KNEE SURGERY     Comment:  x 9  BMI    Body Mass Index: 27.73 kg/m      Reproductive/Obstetrics negative OB ROS                             Anesthesia Physical Anesthesia Plan  ASA: III  Anesthesia Plan: General ETT   Post-op Pain Management: GA combined w/ Regional for post-op pain   Induction: Intravenous  PONV Risk Score and Plan: Ondansetron, Dexamethasone, Midazolam and Treatment may vary due to age or medical condition  Airway Management Planned: Oral ETT  Additional Equipment:   Intra-op Plan:   Post-operative Plan: Extubation in OR  Informed Consent: I have reviewed the patients History and Physical, chart, labs and discussed the procedure including the risks, benefits and alternatives for the proposed anesthesia with the patient or authorized representative who has indicated his/her understanding and  acceptance.     Dental Advisory Given  Plan Discussed with: Anesthesiologist, CRNA and Surgeon  Anesthesia Plan Comments: (Patient consented for risks of anesthesia including but not limited to:  - adverse reactions to medications - damage to teeth, lips or other oral mucosa - sore throat or hoarseness - Damage to heart, brain, lungs or loss of life  Patient voiced understanding.)        Anesthesia Quick Evaluation

## 2019-04-02 NOTE — H&P (Signed)
Paper H&P to be scanned into permanent record. H&P reviewed and patient re-examined. No changes. 

## 2019-04-02 NOTE — Discharge Instructions (Addendum)
Orthopedic discharge instructions: Keep dressing dry and intact.  May shower after dressing changed on post-op day #4 (Monday).  Cover staples with Band-Aids after drying off. Apply ice frequently to shoulder. Take Relafen 500 mg BID OR Aleve 2 tabs BID with meals for 7-10 days, then as necessary. Take oxycodone as prescribed when needed.  May supplement with ES Tylenol if necessary. Keep shoulder immobilizer on at all times except may remove for bathing purposes. Follow-up in 10-14 days or as scheduled.   AMBULATORY SURGERY  DISCHARGE INSTRUCTIONS   1) The drugs that you were given will stay in your system until tomorrow so for the next 24 hours you should not:  A) Drive an automobile B) Make any legal decisions C) Drink any alcoholic beverage   2) You may resume regular meals tomorrow.  Today it is better to start with liquids and gradually work up to solid foods.  You may eat anything you prefer, but it is better to start with liquids, then soup and crackers, and gradually work up to solid foods.   3) Please notify your doctor immediately if you have any unusual bleeding, trouble breathing, redness and pain at the surgery site, drainage, fever, or pain not relieved by medication.    4) Additional Instructions:        Please contact your physician with any problems or Same Day Surgery at [phone], Monday through Friday 6 am to 4 pm, or [place] at [facility] number at [phone].

## 2019-04-02 NOTE — Anesthesia Procedure Notes (Signed)
Anesthesia Regional Block: Interscalene brachial plexus block   Pre-Anesthetic Checklist: ,, timeout performed, Correct Patient, Correct Site, Correct Laterality, Correct Procedure, Correct Position, site marked, Risks and benefits discussed,  Surgical consent,  Pre-op evaluation,  At surgeon's request and post-op pain management  Laterality: Upper and Right  Prep: chloraprep       Needles:  Injection technique: Single-shot  Needle Type: Stimiplex     Needle Length: 5cm  Needle Gauge: 22     Additional Needles:   Procedures:,,,, ultrasound used (permanent image in chart),,,,  Narrative:  Start time: 04/02/2019 7:55 AM End time: 04/02/2019 7:58 AM Injection made incrementally with aspirations every 5 mL.  Performed by: Personally  Anesthesiologist: Arneisha Kincannon, Precious Haws, MD  Additional Notes: Patient consented for risk and benefits of nerve block including but not limited to nerve damage, failed block, bleeding and infection.  Patient voiced understanding.  Functioning IV was confirmed and monitors were applied.  A 61mm 22ga Stimuplex needle was used. Sterile prep,hand hygiene and sterile gloves were used.  Minimal sedation used for procedure.  No paresthesia endorsed by patient during the procedure.  Negative aspiration and negative test dose prior to incremental administration of local anesthetic. The patient tolerated the procedure well with no immediate complications.

## 2019-04-02 NOTE — Anesthesia Postprocedure Evaluation (Signed)
Anesthesia Post Note  Patient: Kenneth Ho.  Procedure(s) Performed: SHOULDER ARTHROSCOPY WITH SUBACROMIAL DECOMPRESSION AND OPEN ROTATOR CUFF REPAIR, OPEN BICEP TENDON REPAIR (Right Shoulder)  Patient location during evaluation: PACU Anesthesia Type: General Level of consciousness: awake and alert Pain management: pain level controlled Vital Signs Assessment: post-procedure vital signs reviewed and stable Respiratory status: spontaneous breathing, nonlabored ventilation, respiratory function stable and patient connected to nasal cannula oxygen Cardiovascular status: blood pressure returned to baseline and stable Postop Assessment: no apparent nausea or vomiting Anesthetic complications: no     Last Vitals:  Vitals:   04/02/19 1051 04/02/19 1107  BP: 109/72 108/79  Pulse: 68 (!) 58  Resp: 15 15  Temp:  (!) 36 C  SpO2: 96% 96%    Last Pain:  Vitals:   04/02/19 1107  PainSc: 0-No pain                 Precious Haws Piscitello

## 2019-04-02 NOTE — Anesthesia Post-op Follow-up Note (Signed)
Anesthesia QCDR form completed.        

## 2019-04-02 NOTE — Op Note (Signed)
04/02/2019  10:24 AM  Patient:   Kenneth Ho.  Pre-Op Diagnosis:   Impingement/tendinopathy with partial-thickness rotator cuff tears and biceps tendinopathy, right shoulder.  Post-Op Diagnosis:   Impingement/tendinopathy with partial-thickness rotator cuff tear, degenerative labral fraying, and biceps tendinopathy, right shoulder.  Procedure:   Extensive arthroscopic debridement, arthroscopic subacromial decompression, mini-open rotator cuff repair using a large [employer] Nephew Regeneten patch, and mini-open biceps tenodesis, right shoulder.  Anesthesia:   General endotracheal with interscalene block using Exparel placed preoperatively by the anesthesiologist.  Surgeon:   Maryagnes Amos, MD  Assistant:   Horris Latino, PA-C; Hunt Oris, PA-S  Findings:   As above.  There were partial-thickness tears involving the articular surface of the mid and posterior portions of the supraspinatus tendon involving approximately 20 to 25% of the tendon footprint.  The rest of the rotator cuff appeared to be in satisfactory condition.  There was evidence of biceps tendinopathy with "lip sticking" and fraying of the tendon at the labral insertion.  There were focal grade 2 chondromalacial changes involving the mid anterior portion of the glenoid.  The articular surface of the humerus appeared to be in satisfactory condition.  Complications:   None  Fluids:   600 cc  Estimated blood loss:   5 cc  Tourniquet time:   None  Drains:   None  Closure:   Staples      Brief clinical note:   The patient is a 53 year old male with a history of progressively worsening right shoulder pain. The patient's symptoms have progressed despite medications, activity modification, etc. The patient's history and examination are consistent with impingement/tendinopathy with a probable rotator cuff tear. The patient's preoperative MRI scan suggested partial-thickness tears of at least the supraspinatus if not the  infraspinatus and/or subscapularis tendons, as well as biceps tendinopathy and degenerative labral fraying. The patient presents at this time for definitive management of these shoulder symptoms.  Procedure:   The patient underwent placement of an interscalene block using Exparel by the anesthesiologist in the preoperative holding area before being brought into the operating room and lain in the supine position. The patient then underwent general endotracheal intubation and anesthesia before being repositioned in the beach chair position using the beach chair positioner. The right shoulder and upper extremity were prepped with ChloraPrep solution before being draped sterilely. Preoperative antibiotics were administered. A timeout was performed to confirm the proper surgical site before the expected portal sites and incision site were injected with 0.5% Sensorcaine with epinephrine. A posterior portal was created and the glenohumeral joint thoroughly inspected with the findings as described above. An anterior portal was created using an outside-in technique. The labrum and rotator cuff were further probed, again confirming the above-noted findings. Areas of labral fraying and synovitis were debrided back to stable margins using the full-radius resector. In addition, the area of grade II chondromalacia involving the mid anterior portion of the glenoid was debrided back to stable margins using the full-radius resector. Finally, the exposed portions of the articular sided rotator cuff tears were debrided back to stable margins using the full-radius resector. The ArthroCare wand was inserted and used to obtain hemostasis as well as to "anneal" the labrum superiorly and anteriorly. The instruments were removed from the joint after suctioning the excess fluid.  The camera was repositioned through the posterior portal into the subacromial space. A separate lateral portal was created using an outside-in technique. The  3.5 mm full-radius resector was introduced and used  to perform a subtotal bursectomy. The ArthroCare wand was then inserted and used to remove the periosteal tissue off the undersurface of the anterior third of the acromion as well as to recess the coracoacromial ligament from its attachment along the anterior and lateral margins of the acromion. The 4.0 mm acromionizing bur was introduced and used to complete the decompression by removing the undersurface of the anterior third of the acromion. The full radius resector was reintroduced to remove any residual bony debris before the ArthroCare wand was reintroduced to obtain hemostasis. The instruments were then removed from the subacromial space after suctioning the excess fluid.  An approximately 4-5 cm incision was made over the anterolateral aspect of the shoulder beginning at the anterolateral corner of the acromion and extending distally in line with the bicipital groove. This incision was carried down through the subcutaneous tissues to expose the deltoid fascia. The raphae between the anterior and middle thirds was identified and this plane developed to provide access into the subacromial space. Additional bursal tissues were debrided sharply using Metzenbaum scissors. The rotator cuff was identified and carefully inspected. The bursal surface of the rotator cuff was in pristine condition. Careful palpation revealed a area of thinning/softness involving the mid/posterior portion of the supraspinatus insertional fibers, consistent with the findings noted arthroscopically. Therefore, given that the tear involved only approximately 25% of the tendon footprint, it was felt best to proceed with application of a [place] patch rather than to complete the tear and repair the entire tendon. A large [place] patch was selected and secured over the supraspinatus and infraspinatus tendons using the appropriate bone and soft tissue staples. The  margins were debrided sharply with a #15 blade and the exposed greater tuberosity roughened with a rongeur. An apparent watertight closure was obtained.  The bicipital groove was identified by palpation and opened for 1-1.5 cm. The biceps tendon stump was retrieved through this defect. The floor of the bicipital groove was roughened with a curet before a single Biomet 2.9 mm JuggerKnot anchor was inserted. Both sets of sutures were passed through the biceps tendon and tied securely to effect the tenodesis. The bicipital sheath was reapproximated using two #0 Ethibond interrupted sutures, incorporating the biceps tendon to further reinforce the tenodesis.  The wound was copiously irrigated with sterile saline solution before the deltoid raphae was reapproximated using 2-0 Vicryl interrupted sutures. The subcutaneous tissues were closed in two layers using 2-0 Vicryl interrupted sutures before the skin was closed using staples. The portal sites also were closed using staples. A sterile bulky dressing was applied to the shoulder before the arm was placed into a shoulder immobilizer. The patient was then awakened, extubated, and returned to the recovery room in satisfactory condition after tolerating the procedure well.

## 2019-04-02 NOTE — Transfer of Care (Signed)
Immediate Anesthesia Transfer of Care Note  Patient: Kenneth Ho.  Procedure(s) Performed: SHOULDER ARTHROSCOPY WITH SUBACROMIAL DECOMPRESSION AND OPEN ROTATOR CUFF REPAIR, OPEN BICEP TENDON REPAIR (Right Shoulder)  Patient Location: PACU  Anesthesia Type:GA combined with regional for post-op pain  Level of Consciousness: drowsy  Airway & Oxygen Therapy: Patient Spontanous Breathing and Patient connected to face mask oxygen  Post-op Assessment: Report given to RN and Post -op Vital signs reviewed and stable  Post vital signs: Reviewed and stable  Last Vitals:  Vitals Value Taken Time  BP 105/59 04/02/19 1022  Temp 36 C 04/02/19 1022  Pulse 55 04/02/19 1025  Resp 15 04/02/19 1025  SpO2 99 % 04/02/19 1025  Vitals shown include unvalidated device data.  Last Pain:  Vitals:   04/02/19 1022  PainSc: Asleep         Complications: No apparent anesthesia complications

## 2019-04-02 NOTE — Anesthesia Procedure Notes (Signed)
Procedure Name: Intubation Date/Time: 04/02/2019 8:48 AM Performed by: Eben Burow, CRNA Pre-anesthesia Checklist: Patient identified, Emergency Drugs available, Suction available and Patient being monitored Patient Re-evaluated:Patient Re-evaluated prior to induction Oxygen Delivery Method: Circle system utilized Preoxygenation: Pre-oxygenation with 100% oxygen Induction Type: IV induction Ventilation: Mask ventilation without difficulty Laryngoscope Size: McGraph and 4 Tube type: Oral Tube size: 7.5 mm Number of attempts: 1 Airway Equipment and Method: Stylet and Oral airway Placement Confirmation: ETT inserted through vocal cords under direct vision,  positive ETCO2 and breath sounds checked- equal and bilateral Secured at: 22 cm Tube secured with: Tape Dental Injury: Teeth and Oropharynx as per pre-operative assessment

## 2019-04-03 ENCOUNTER — Encounter: Payer: Self-pay | Admitting: Surgery

## 2019-09-01 ENCOUNTER — Other Ambulatory Visit: Payer: Self-pay

## 2019-09-01 ENCOUNTER — Encounter: Payer: Self-pay | Admitting: Emergency Medicine

## 2019-09-01 ENCOUNTER — Ambulatory Visit
Admission: EM | Admit: 2019-09-01 | Discharge: 2019-09-01 | Disposition: A | Discharge status: Home / Self Care | Payer: BC Managed Care – PPO | Attending: Urgent Care | Admitting: Urgent Care

## 2019-09-01 DIAGNOSIS — R110 Nausea: Secondary | ICD-10-CM | POA: Insufficient documentation

## 2019-09-01 DIAGNOSIS — F17210 Nicotine dependence, cigarettes, uncomplicated: Secondary | ICD-10-CM | POA: Insufficient documentation

## 2019-09-01 DIAGNOSIS — R42 Dizziness and giddiness: Secondary | ICD-10-CM

## 2019-09-01 LAB — CBC
HCT: 44 % (ref 39.0–52.0)
Hemoglobin: 15 g/dL (ref 13.0–17.0)
MCH: 29.3 pg (ref 26.0–34.0)
MCHC: 34.1 g/dL (ref 30.0–36.0)
MCV: 85.9 fL (ref 80.0–100.0)
Platelets: 247 10*3/uL (ref 150–400)
RBC: 5.12 MIL/uL (ref 4.22–5.81)
RDW: 13.7 % (ref 11.5–15.5)
WBC: 6.6 10*3/uL (ref 4.0–10.5)
nRBC: 0 % (ref 0.0–0.2)

## 2019-09-01 LAB — BASIC METABOLIC PANEL
Anion gap: 8 (ref 5–15)
BUN: 18 mg/dL (ref 6–20)
CO2: 26 mmol/L (ref 22–32)
Calcium: 9.1 mg/dL (ref 8.9–10.3)
Chloride: 102 mmol/L (ref 98–111)
Creatinine, Ser: 1.11 mg/dL (ref 0.61–1.24)
GFR calc Af Amer: >60 mL/min (ref >60)
GFR calc non Af Amer: >60 mL/min (ref >60)
Glucose, Bld: 137 mg/dL — ABNORMAL HIGH (ref 70–99)
Potassium: 4.3 mmol/L (ref 3.5–5.1)
Sodium: 136 mmol/L (ref 135–145)

## 2019-09-01 LAB — TROPONIN I (HIGH SENSITIVITY): Troponin I (High Sensitivity): <2 ng/L (ref <18)

## 2019-09-01 MED ORDER — MECLIZINE HCL 25 MG PO TABS: 25.0000 mg | ORAL_TABLET | Freq: Three times a day (TID) | ORAL | 0 refills | Status: DC | PRN

## 2019-09-01 NOTE — Discharge Instructions (Addendum)
It was very nice seeing you today in clinic. Thank you for entrusting me with your care.   You were not orthostatic (blood pressure did not drop with position changes).  You EKG was normal.  Labs that have resulted were all normal. I still have one that is pending that we will contact you about if it is elevated.  Make sure you are staying well hydrated.  Use the medication for dizziness that I gave you.   Make arrangements to follow up with your regular doctor in 1 week for re-evaluation if not improving. If your symptoms/condition worsens, please seek follow up care either here or in the ER. Please remember, our [facility] providers are "right here with you" when you need [place].   Again, it was my pleasure to take care of you today. Thank you for choosing our clinic. I hope that you start to feel better quickly.   Quentin Mulling, MSN, APRN, FNP-C, CEN Advanced Practice Provider [facility]

## 2019-09-01 NOTE — ED Triage Notes (Signed)
Pt c/o dizziness and feeling like he is shaking inside. Started about 3-4 days ago but worse today. He states that it feels the room is spinning. He states sometimes he has random sweats. He denies shortness of breath or chest pain or palpitations. He has some nausea when the dizziness is the worst.

## 2019-09-02 LAB — HEMOGLOBIN A1C
Hgb A1c MFr Bld: 5.8 % — ABNORMAL HIGH (ref 4.8–5.6)
Mean Plasma Glucose: 120 mg/dL

## 2019-09-03 NOTE — ED Provider Notes (Signed)
[place], [place]   Name: Kenneth Ho. DOB: 1965-10-12 MRN: [id number] CSN: [id number] PCP: Patient, No Pcp Per  Arrival date and time:  09/01/19 1033  Chief Complaint:  Dizziness  NOTE: Prior to seeing the patient today, I have reviewed the triage nursing documentation and vital signs. Clinical staff has updated patient's PMH/PSHx, current medication list, and drug allergies/intolerances to ensure comprehensive history available to assist in medical decision making.   History:   HPI: Kenneth Ho. is a 54 y.o. male who presents today with complaints of dizziness and feeling like he is "shaking inside". Patient notes that the symptoms started with acute onset about 3-4 days ago, however he advises that they are worse today. Patient reports that he feels as if the room is spinning, breaks out into a cold sweat, and feels as if he is going to pass out. He states, "I just get the shakes like you do when you don't eat". He denies any associated chest pain, shortness of breath, palpitations, or headaches. Patient reports that symptoms are improved after "chugging some Gatorade". Dizziness is associated with nausea. He has not experienced any vomiting, diarrhea, or abdominal pain. Patient denies being in close contact with anyone known to be ill. He has not been tested for SARS-CoV-2 (novel coronavirus) in the past 14 days. Patient does not have a PMH that is significant for HTN, DM, or any known cardiac disease. Despite his symptoms, patient has not taken any over the counter interventions to help improve/relieve his reported symptoms at home. Patient presents today in NAD. VSS at time of triage.  Past Medical History:  Diagnosis Date  . Shoulder pain     Past Surgical History:  Procedure Laterality Date  . KNEE SURGERY     x 9  . SHOULDER ARTHROSCOPY WITH SUBACROMIAL DECOMPRESSION AND OPEN ROTATOR C Right 04/02/2019   Procedure: SHOULDER ARTHROSCOPY WITH SUBACROMIAL DECOMPRESSION AND  OPEN ROTATOR CUFF REPAIR, OPEN BICEP TENDON REPAIR;  Surgeon: Corky Mull, MD;  Location: [facility] ORS;  Service: Orthopedics;  Laterality: Right;    Family History  Problem Relation Age of Onset  . M[place]oya disease Mother   . Cancer Father   . Diabetes Father     Social History   Tobacco Use  . Smoking status: Current Every Day Smoker    Packs/day: 1.00  . Smokeless tobacco: Never Used  Substance Use Topics  . Alcohol use: Yes    Comment: socially  . Drug use: Never    There are no problems to display for this patient.   Home Medications:    Current Meds  Medication Sig  . naproxen sodium (ALEVE) 220 MG tablet Take 660 mg by mouth 2 (two) times daily with a meal.    Allergies:   Patient has no known allergies.  Review of Systems (ROS):  Review of systems NEGATIVE unless otherwise noted in narrative H&P section.   Vital Signs: Today's Vitals   09/01/19 1103 09/01/19 1104 09/01/19 1107 09/01/19 1224  BP:   117/80   Pulse:   61   Resp:   18   Temp:   97.6 F (36.4 C)   TempSrc:   Oral   SpO2:   96%   Weight:  210 lb (95.3 kg)    Height:  5\' 11"  (1.803 m)    PainSc: 0-No pain   0-No pain    Physical Exam: Physical Exam  Constitutional: He is oriented to person, place, and time and  well-developed, well-nourished, and in no distress.  HENT:  Head: Normocephalic and atraumatic.  Eyes: Pupils are equal, round, and reactive to light.  Neck: No tracheal deviation present. No thyromegaly present.  Cardiovascular: Normal rate, regular rhythm, normal heart sounds and intact distal pulses.  Pulmonary/Chest: Effort normal and breath sounds normal.  Abdominal: Soft. Normal appearance, normal aorta and bowel sounds are normal. There is no abdominal tenderness.  Musculoskeletal:     Cervical back: Normal range of motion and neck supple.  Lymphadenopathy:    He has no cervical adenopathy.  Neurological: He is alert and oriented to person, place, and time. He has normal  sensation, normal strength, normal reflexes and intact cranial nerves. Gait normal.  Skin: Skin is warm and dry. No rash noted. He is not diaphoretic.  Psychiatric: Memory, affect and judgment normal. His mood appears anxious.  Nursing note and vitals reviewed.   Urgent Care Treatments / Results:   LABS: PLEASE NOTE: all labs that were ordered this encounter are listed, however only abnormal results are displayed. Labs Reviewed  BASIC METABOLIC PANEL - Abnormal; Notable for the following components:      Result Value   Glucose, Bld 137 (*)    All other components within normal limits  HEMOGLOBIN A1C  CBC  TROPONIN I (HIGH SENSITIVITY)    URGENT CARE ECG REPORT Date: 09/01/2019 Time ECG obtained: 1130 AM Rate: 57 bpm Rhythm: SB with SA Axis (leads I and aVF): normal Intervals: PR 146 ms. QTc 410 ms.  ST segment and T wave changes: No evidence of ST segment elevation or depression Comparison: Similar to previous tracing obtained on 02/25/2012   RADIOLOGY: No results found.  PROCEDURES: Procedures  MEDICATIONS RECEIVED THIS VISIT: Medications - No data to display  PERTINENT CLINICAL COURSE NOTES/UPDATES:   Initial Impression / Assessment and Plan / Urgent Care Course:  Pertinent labs & imaging results that were available during my care of the patient were personally reviewed by me and considered in my medical decision making (see lab/imaging section of note for values and interpretations).  Kenneth Ho. is a 54 y.o. male who presents to [facility] today with complaints of Dizziness  Patient is well appearing overall in clinic today. He does not appear to be in any acute distress. Presenting symptoms (see HPI) and exam as documented above. Symptoms presents for the last few days, however worse today. Patient with complaints of dizziness, nausea, sweating, and feeling presyncopal. No CP or SOB. Eating and drinking normally. No sick contacts. No significant  PMH. Will pursue further workup as follows:   Orthostatic VS obtained by clinic nursing staff. No significant changes (no orthostasis).    EKG shows SB with SA at a rate of 57. No ST segment elevation of depression.    WBC 6.6. Hemoglobin 15.0, hematocrit 44.0, MCV 85.9, MCH 29.3, and platelets 247 K/uL.   Na 136, K+ 4.3, glucose 137, BUN 18, and creatinine 1.11 mg/dL. Estimated Creatinine Clearance: 88.6 mL/min (by C-G formula based on SCr of 1.11 mg/dL).    hs-TnI normal at < 2 ng/L   Hgb A1c collected and sent for testing.   Workup reviewed with patient. There is no clear etiology of patient's symptoms today. Glucose is elevated. Diabetes is part of the differential. Hgb A1c pending. Discussed likely benign etiology of his symptoms; ?? BPPV.  Patient was advised that he would be contacted if treatment required for his pending labs. In the interim, patient encouraged to increase  fluid intake as much as possible. Will send in prescription for meclizine for patient to use on a PRN basis for his vertiginous symptoms. Patient encouraged to change positions slowly and take it easy over the next few days. If not improving, of if he develops any further concerning symptoms, he needs to be seen by his PCP or in the emergency department.   Discussed follow up with primary care physician in 1 week for re-evaluation. I have reviewed the follow up and strict return precautions for any new or worsening symptoms. Patient is aware of symptoms that would be deemed urgent/emergent, and would thus require further evaluation either here or in the emergency department. At the time of discharge, he verbalized understanding and consent with the discharge plan as it was reviewed with him. All questions were fielded by provider and/or clinic staff prior to patient discharge.    Final Clinical Impressions / Urgent Care Diagnoses:   Final diagnoses:  Dizziness    New Prescriptions:  [place] Controlled Substance  Registry consulted? Not Applicable  Meds ordered this encounter  Medications  . meclizine (ANTIVERT) 25 MG tablet    Sig: Take 1 tablet (25 mg total) by mouth 3 (three) times daily as needed for dizziness.    Dispense:  30 tablet    Refill:  0    Recommended Follow up Care:  Patient encouraged to follow up with the following provider within the specified time frame, or sooner as dictated by the severity of his symptoms. As always, he was instructed that for any urgent/emergent care needs, he should seek care either here or in the emergency department for more immediate evaluation.  Follow-up Information    PCP In 1 week.   Why: General reassessment of symptoms if not improving        NOTE: This note was prepared using Scientist, clinical (histocompatibility and immunogenetics) along with smaller Lobbyist. Despite my best ability to proofread, there is the potential that transcriptional errors may still occur from this process, and are completely unintentional.    Verlee Monte, NP 09/03/19 1608

## 2023-10-17 ENCOUNTER — Encounter: Payer: Self-pay | Admitting: Nurse Practitioner

## 2023-10-17 ENCOUNTER — Ambulatory Visit: Payer: BC Managed Care – PPO | Admitting: Nurse Practitioner

## 2023-10-17 VITALS — BP 114/80 | HR 63 | Temp 98.1°F | Ht 70.0 in | Wt 228.6 lb

## 2023-10-17 DIAGNOSIS — Z1329 Encounter for screening for other suspected endocrine disorder: Secondary | ICD-10-CM

## 2023-10-17 DIAGNOSIS — Z87891 Personal history of nicotine dependence: Secondary | ICD-10-CM | POA: Diagnosis not present

## 2023-10-17 DIAGNOSIS — Z0001 Encounter for general adult medical examination with abnormal findings: Secondary | ICD-10-CM | POA: Insufficient documentation

## 2023-10-17 DIAGNOSIS — Z13220 Encounter for screening for lipoid disorders: Secondary | ICD-10-CM

## 2023-10-17 DIAGNOSIS — Z125 Encounter for screening for malignant neoplasm of prostate: Secondary | ICD-10-CM

## 2023-10-17 DIAGNOSIS — Z800 Family history of malignant neoplasm of digestive organs: Secondary | ICD-10-CM

## 2023-10-17 DIAGNOSIS — J449 Chronic obstructive pulmonary disease, unspecified: Secondary | ICD-10-CM

## 2023-10-17 DIAGNOSIS — Z0000 Encounter for general adult medical examination without abnormal findings: Secondary | ICD-10-CM | POA: Diagnosis not present

## 2023-10-17 DIAGNOSIS — E669 Obesity, unspecified: Secondary | ICD-10-CM

## 2023-10-17 DIAGNOSIS — Z1211 Encounter for screening for malignant neoplasm of colon: Secondary | ICD-10-CM

## 2023-10-17 LAB — COMPREHENSIVE METABOLIC PANEL WITH GFR
ALT: 20 U/L (ref 0–53)
AST: 19 U/L (ref 0–37)
Albumin: 4.6 g/dL (ref 3.5–5.2)
Alkaline Phosphatase: 63 U/L (ref 39–117)
BUN: 13 mg/dL (ref 6–23)
CO2: 31 meq/L (ref 19–32)
Calcium: 9.7 mg/dL (ref 8.4–10.5)
Chloride: 102 meq/L (ref 96–112)
Creatinine, Ser: 1.36 mg/dL (ref 0.40–1.50)
GFR: 56.76 mL/min — ABNORMAL LOW (ref >60.00)
Glucose, Bld: 95 mg/dL (ref 70–99)
Potassium: 4.2 meq/L (ref 3.5–5.1)
Sodium: 139 meq/L (ref 135–145)
Total Bilirubin: 1.8 mg/dL — ABNORMAL HIGH (ref 0.2–1.2)
Total Protein: 6.9 g/dL (ref 6.0–8.3)

## 2023-10-17 LAB — CBC WITH DIFFERENTIAL/PLATELET
Basophils Absolute: 0 10*3/uL (ref 0.0–0.1)
Basophils Relative: 0.6 % (ref 0.0–3.0)
Eosinophils Absolute: 0.2 10*3/uL (ref 0.0–0.7)
Eosinophils Relative: 4.1 % (ref 0.0–5.0)
HCT: 47.8 % (ref 39.0–52.0)
Hemoglobin: 16.5 g/dL (ref 13.0–17.0)
Lymphocytes Relative: 25.4 % (ref 12.0–46.0)
Lymphs Abs: 1.3 10*3/uL (ref 0.7–4.0)
MCHC: 34.4 g/dL (ref 30.0–36.0)
MCV: 86.2 fl (ref 78.0–100.0)
Monocytes Absolute: 0.4 10*3/uL (ref 0.1–1.0)
Monocytes Relative: 8.7 % (ref 3.0–12.0)
Neutro Abs: 3 10*3/uL (ref 1.4–7.7)
Neutrophils Relative %: 61.2 % (ref 43.0–77.0)
Platelets: 224 10*3/uL (ref 150.0–400.0)
RBC: 5.54 Mil/uL (ref 4.22–5.81)
RDW: 13.9 % (ref 11.5–15.5)
WBC: 4.9 10*3/uL (ref 4.0–10.5)

## 2023-10-17 LAB — HEMOGLOBIN A1C: Hgb A1c MFr Bld: 6.2 % (ref 4.6–6.5)

## 2023-10-17 LAB — LIPID PANEL
Cholesterol: 185 mg/dL (ref 0–200)
HDL: 33 mg/dL — ABNORMAL LOW (ref >39.00)
LDL Cholesterol: 118 mg/dL — ABNORMAL HIGH (ref 0–99)
NonHDL: 151.91
Total CHOL/HDL Ratio: 6
Triglycerides: 170 mg/dL — ABNORMAL HIGH (ref 0.0–149.0)
VLDL: 34 mg/dL (ref 0.0–40.0)

## 2023-10-17 LAB — TSH: TSH: 1.92 u[IU]/mL (ref 0.35–5.50)

## 2023-10-17 LAB — PSA: PSA: 0.22 ng/mL (ref 0.10–4.00)

## 2023-10-17 MED ORDER — BUDESONIDE-FORMOTEROL FUMARATE 80-4.5 MCG/ACT IN AERO: 2.0000 | INHALATION_SPRAY | Freq: Two times a day (BID) | RESPIRATORY_TRACT | 5 refills | Status: AC
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
## 2023-10-18 ENCOUNTER — Ambulatory Visit: Payer: Self-pay | Admitting: Nurse Practitioner

## 2023-10-18 DIAGNOSIS — R944 Abnormal results of kidney function studies: Secondary | ICD-10-CM

## 2023-10-18 DIAGNOSIS — R17 Unspecified jaundice: Secondary | ICD-10-CM

## 2023-10-24 ENCOUNTER — Encounter: Payer: Self-pay | Admitting: Nurse Practitioner

## 2023-10-24 NOTE — Assessment & Plan Note (Addendum)
 Physical exam complete. We will order routine lab work as outlined and contact patient with results. He is overdue for routine screenings and vaccinations. Discussed colon cancer screening and shingles vaccine efficacy. PSA screening ordered in lab work. Declines flu and COVID vaccines. Tetanus vaccine is up to date. Refer for colonoscopy due to age and family history of colon cancer. Recommend considering shingles vaccination. Encourage routine health maintenance, regular dental and eye exams. Encourage healthy diet and regular physical activity. Return to care in one year, pending lab results, sooner as needed.

## 2023-10-24 NOTE — Assessment & Plan Note (Signed)
 COPD with a history of smoking and current vaping. Maintenance inhaler benefits and lung cancer screening were discussed. Prescribe Symbicort  for daily use and order lung cancer screening.

## 2023-10-24 NOTE — Assessment & Plan Note (Addendum)
 Advised vaping cessation and continued tobacco cessation. Lung cancer screening ordered.

## 2023-10-24 NOTE — Assessment & Plan Note (Signed)
 Check A1c today. Encourage healthy diet and regular physical activity.

## 2023-11-01 ENCOUNTER — Telehealth: Payer: Self-pay

## 2023-11-01 DIAGNOSIS — Z122 Encounter for screening for malignant neoplasm of respiratory organs: Secondary | ICD-10-CM

## 2023-11-01 DIAGNOSIS — Z87891 Personal history of nicotine dependence: Secondary | ICD-10-CM

## 2023-11-01 NOTE — Telephone Encounter (Signed)
.  Lung Cancer Screening Narrative/Criteria Questionnaire (Cigarette Smokers Only- No Cigars/Pipes/vapes)   Kenneth A Cobarrubias Jr.   SDMV:11/12/2023 at 10:00 am     December 04, 1965   LDCT: 76/30/2025 at 8:00 am at [facility]    58 y.o.   Phone: [phone]  Lung Screening Narrative (confirm age 36-77 yrs Medicare / 50-80 yrs Private pay insurance)   Insurance information:BCBS   Referring Provider: Julietta Ogren, NP   This screening involves an initial phone call with a team member from our program. It is called a shared decision making visit. The initial meeting is required by  insurance and Medicare to make sure you understand the program. This appointment takes about 15-20 minutes to complete. You will complete the screening scan at your scheduled date/time.  This scan takes about 5-10 minutes to complete. You can eat and drink normally before and after the scan.  Criteria questions for Lung Cancer Screening:   Are you a current or former smoker? Former Age began smoking: 13   If you are a former smoker, what year did you quit smoking? Quit 2022 (within 15 yrs)   To calculate your smoking history, I need an accurate estimate of how many packs of cigarettes you smoked per day and for how many years. (Not just the number of PPD you are now smoking)   Years smoking 45 x Packs per day 2 = Pack years 90   (at least 20 pack yrs)   (Make sure they understand that we need to know how much they have smoked in the past, not just the number of PPD they are smoking now)  Do you have a personal history of cancer?  No    Do you have a family history of cancer? Yes  (cancer type and and relative) Father colon cancer  Are you coughing up blood?  No  Have you had unexplained weight loss of 15 lbs or more in the last 6 months? No  It looks like you meet all criteria.  When would be a good time for us  to schedule you for this screening?   Additional information: N/A

## 2023-11-12 ENCOUNTER — Ambulatory Visit: Payer: Self-pay | Admitting: Physician Assistant

## 2023-11-12 ENCOUNTER — Encounter: Payer: Self-pay | Admitting: Physician Assistant

## 2023-11-12 DIAGNOSIS — Z87891 Personal history of nicotine dependence: Secondary | ICD-10-CM | POA: Diagnosis not present

## 2023-11-12 NOTE — Patient Instructions (Signed)

## 2023-11-12 NOTE — Progress Notes (Signed)
 Virtual Visit via Telephone Note  I connected with Kenneth Ho on 11/12/23 at 9:58 AM  by telephone and verified that I am speaking with the correct person using two identifiers.  Location: Patient: home Provider: working virtually from home   I discussed the limitations, risks, security and privacy concerns of performing an evaluation and management service by telephone and the availability of in person appointments. I also discussed with the patient that there may be a patient responsible charge related to this service. The patient expressed understanding and agreed to proceed.       Shared Decision Making Visit Lung Cancer Screening Program [phone])   Eligibility: Age 58 Pack Years Smoking History Calculation 90 (# packs/per year x # years smoked) Recent History of coughing up blood  no Unexplained weight loss? No ( >Than 15 pounds within the last 6 months ) Prior History Lung / other cancer No (Diagnosis within the last 5 years already requiring surveillance chest CT Scans). Smoking Status Former Smoker Former Smokers: Years since quit: 3 years  Quit Date: 2022  Visit Components: Discussion included one or more decision making aids? Yes Discussion included risk/benefits of screening. Yes Discussion included potential follow up diagnostic testing for abnormal scans. Yes Discussion included meaning and risk of over diagnosis. Yes Discussion included meaning and risk of False Positives. Yes Discussion included meaning of total radiation exposure. Yes  Counseling Included: Importance of adherence to annual lung cancer LDCT screening. Yes Impact of comorbidities on ability to participate in the program. Yes Ability and willingness to under diagnostic treatment. Yes  Smoking Cessation Counseling: Former Smokers:  Discussed the importance of maintaining cigarette abstinence. Yes Diagnosis Code: Personal History of Nicotine Dependence. S12.108 Information about  tobacco cessation classes and interventions provided to patient. Yes Written Order for Lung Cancer Screening with LDCT placed in Epic. Yes (CT Chest Lung Cancer Screening Low Dose W/O CM) [id number] Z12.2-Screening of respiratory organs Z87.891-Personal history of nicotine dependence    I spent 25 minutes of face to face time/virtual visit time  with the patient discussing the risks and benefits of lung cancer screening. We took the time to pause at intervals to allow for questions to be asked and answered to ensure understanding. We discussed that they had taken the single most powerful action possible to decrease their risk of developing lung cancer when they quit smoking. I counseled them to remain smoke free, and to contact the office if they ever had the desire to smoke again so that we can provide resources and tools to help support the effort to remain smoke free. We discussed the time and location of the scan, and they  will receive a call or letter with the results within  24-72 hours of receiving them. They have the office contact information in the event they have questions.   They verbalized understanding of all of the above and had no further questions.    I explained to the patient that there has been a high incidence of coronary artery disease noted on these exams. I explained that this is a non-gated exam therefore degree or severity cannot be determined. This patient is not on statin therapy. I have asked the patient to follow-up with their PCP regarding any incidental finding of coronary artery disease and management with diet or medication as they feel is clinically indicated. The patient verbalized understanding of the above and had no further questions.      Kenneth Ho Kenneth Hirschhorn, PA-C

## 2023-11-18 ENCOUNTER — Ambulatory Visit
Admission: RE | Admit: 2023-11-18 | Discharge: 2023-11-18 | Disposition: A | Discharge status: Home / Self Care | Payer: Self-pay | Source: Ambulatory Visit | Attending: Acute Care | Admitting: Acute Care

## 2023-11-18 DIAGNOSIS — Z87891 Personal history of nicotine dependence: Secondary | ICD-10-CM | POA: Diagnosis present

## 2023-11-18 DIAGNOSIS — Z122 Encounter for screening for malignant neoplasm of respiratory organs: Secondary | ICD-10-CM | POA: Insufficient documentation

## 2023-11-26 ENCOUNTER — Telehealth: Payer: Self-pay

## 2023-11-26 NOTE — Telephone Encounter (Signed)
 Call Report from Tiffany  IMPRESSION: Lung-RADS 4A, suspicious. Follow up low-dose chest CT without contrast in 3 months (please use the following order, CT CHEST LCS NODULE FOLLOW-UP W/O CM) is recommended. Alternatively, PET may be considered when there is a solid component 8mm or larger.   Dominant 9.5 mm subpleural/perifissural right lower lobe nodule. Follow-up low-dose lung cancer screening CT chest is suggested in 3 months.   Aortic Atherosclerosis (ICD10-I70.0) and Emphysema (ICD10-J43.9).

## 2023-11-27 NOTE — Telephone Encounter (Signed)
 Results have been reviewed by Ruthell, NP. She is in agreement with radiologist for patient to complete 3 month scan to evaluate 9.5 mm nodule. Please call patient and review results. Send results and plan to PCP. Place order.

## 2023-11-28 ENCOUNTER — Other Ambulatory Visit: Payer: Self-pay

## 2023-11-28 DIAGNOSIS — R911 Solitary pulmonary nodule: Secondary | ICD-10-CM

## 2023-11-28 DIAGNOSIS — Z122 Encounter for screening for malignant neoplasm of respiratory organs: Secondary | ICD-10-CM

## 2023-11-28 DIAGNOSIS — Z87891 Personal history of nicotine dependence: Secondary | ICD-10-CM

## 2023-11-28 NOTE — Telephone Encounter (Signed)
 Spoke with patient and reviewed results. He is in agreement to complete a f/u scan in 3 months, due around 02/18/2024 to evaluate the lung nodules, largest, 9.5 mm. Order placed. He is aware he will get a call closer to due date as he preferred not to schedule appt today. Results and plan to PCP.

## 2023-12-31 ENCOUNTER — Encounter: Payer: Self-pay | Admitting: Nurse Practitioner

## 2024-02-24 ENCOUNTER — Ambulatory Visit
Admission: RE | Admit: 2024-02-24 | Discharge: 2024-02-24 | Disposition: A | Discharge status: Home / Self Care | Source: Ambulatory Visit | Attending: Acute Care | Admitting: Acute Care

## 2024-02-24 DIAGNOSIS — Z87891 Personal history of nicotine dependence: Secondary | ICD-10-CM | POA: Insufficient documentation

## 2024-02-24 DIAGNOSIS — Z122 Encounter for screening for malignant neoplasm of respiratory organs: Secondary | ICD-10-CM | POA: Insufficient documentation

## 2024-02-24 DIAGNOSIS — R911 Solitary pulmonary nodule: Secondary | ICD-10-CM | POA: Insufficient documentation

## 2024-02-26 ENCOUNTER — Encounter: Payer: Self-pay | Admitting: Nurse Practitioner

## 2024-02-27 ENCOUNTER — Other Ambulatory Visit: Payer: Self-pay

## 2024-02-27 ENCOUNTER — Telehealth: Payer: Self-pay

## 2024-02-27 DIAGNOSIS — Z122 Encounter for screening for malignant neoplasm of respiratory organs: Secondary | ICD-10-CM

## 2024-02-27 DIAGNOSIS — Z87891 Personal history of nicotine dependence: Secondary | ICD-10-CM

## 2024-02-27 DIAGNOSIS — R911 Solitary pulmonary nodule: Secondary | ICD-10-CM

## 2024-02-27 NOTE — Telephone Encounter (Signed)
 Called patient to review recent Lung CT results, LVM to call office back to review. Previous nodule 9.5 mm now 7.9 mm. Other nodules measuring 5.9 mm or less.  IMPRESSION: 1. Anterior right lower lobe nodule has decreased slightly in size in the interval. Lung-RADS 3, probably benign findings. Short-term follow-up in 6 months is recommended with repeat low-dose chest CT without contrast (please use the following order, CT CHEST LCS NODULE FOLLOW-UP W/O CM). 2. Cholelithiasis. 3.  Aortic atherosclerosis (ICD10-I70.0). 4.  Emphysema (ICD10-J43.9).

## 2024-02-27 NOTE — Telephone Encounter (Signed)
 Spoke with patient and reviewed recent Lung CT results. He is in agreement to complete a 6 month follow scan in April 2026. He is aware the nodule of concern has shrank since 3 months ago. He requested a call closer to due date to schedule f/u scan. Reminder set. Order placed. Results and plan to PCP.

## 2024-03-06 ENCOUNTER — Other Ambulatory Visit: Payer: Self-pay | Admitting: Nurse Practitioner

## 2024-03-06 DIAGNOSIS — E7800 Pure hypercholesterolemia, unspecified: Secondary | ICD-10-CM

## 2024-03-06 MED ORDER — ROSUVASTATIN CALCIUM 10 MG PO TABS: 10.0000 mg | ORAL_TABLET | Freq: Every day | ORAL | 3 refills | Status: AC

## 2024-04-07 ENCOUNTER — Encounter: Payer: Self-pay | Admitting: Nurse Practitioner

## 2024-04-14 ENCOUNTER — Other Ambulatory Visit: Payer: Self-pay | Admitting: Nurse Practitioner

## 2024-04-14 DIAGNOSIS — R7303 Prediabetes: Secondary | ICD-10-CM | POA: Insufficient documentation

## 2024-04-24 ENCOUNTER — Other Ambulatory Visit

## 2024-04-24 DIAGNOSIS — E7800 Pure hypercholesterolemia, unspecified: Secondary | ICD-10-CM

## 2024-04-24 DIAGNOSIS — R7303 Prediabetes: Secondary | ICD-10-CM | POA: Diagnosis not present

## 2024-04-24 LAB — URINALYSIS, ROUTINE W REFLEX MICROSCOPIC
Bilirubin Urine: NEGATIVE
Hgb urine dipstick: NEGATIVE
Ketones, ur: NEGATIVE
Leukocytes,Ua: NEGATIVE
Nitrite: NEGATIVE
Specific Gravity, Urine: 1.015 (ref 1.000–1.030)
Total Protein, Urine: NEGATIVE
Urine Glucose: 100 — AB
Urobilinogen, UA: 1 (ref 0.0–1.0)
pH: 7 (ref 5.0–8.0)

## 2024-04-24 LAB — LIPID PANEL
Cholesterol: 95 mg/dL (ref 0–200)
HDL: 32.2 mg/dL — ABNORMAL LOW (ref >39.00)
LDL Cholesterol: 41 mg/dL (ref 0–99)
NonHDL: 63.08
Total CHOL/HDL Ratio: 3
Triglycerides: 112 mg/dL (ref 0.0–149.0)
VLDL: 22.4 mg/dL (ref 0.0–40.0)

## 2024-04-24 LAB — HEPATIC FUNCTION PANEL
ALT: 14 U/L (ref 0–53)
AST: 14 U/L (ref 0–37)
Albumin: 4.4 g/dL (ref 3.5–5.2)
Alkaline Phosphatase: 60 U/L (ref 39–117)
Bilirubin, Direct: 0.3 mg/dL (ref 0.0–0.3)
Total Bilirubin: 1.5 mg/dL — ABNORMAL HIGH (ref 0.2–1.2)
Total Protein: 6.4 g/dL (ref 6.0–8.3)

## 2024-04-24 LAB — HEMOGLOBIN A1C: Hgb A1c MFr Bld: 5.9 % (ref 4.6–6.5)

## 2024-05-12 ENCOUNTER — Ambulatory Visit: Payer: Self-pay | Admitting: Nurse Practitioner

## 2024-05-26 ENCOUNTER — Telehealth: Payer: Self-pay | Admitting: Nurse Practitioner

## 2024-05-26 NOTE — Telephone Encounter (Signed)
 Spouse dropped off paperwork to be completed by Kenneth Ho. It's in the color folder up front

## 2024-06-04 NOTE — Telephone Encounter (Signed)
 Form placed back in provider to be signed folder    Highlighted portion for provider signature pt just needs an okay from PCP dud to [employer] CPE finding sugar in pts urine he just needs PCP to say he is fine and that he can get his medical card PCP will be making an addendum

## 2024-06-09 NOTE — Telephone Encounter (Signed)
 Mychart sent to pt.

## 2024-06-09 NOTE — Telephone Encounter (Signed)
 Pt's son came to pick up forms today

## 2024-10-23 ENCOUNTER — Encounter: Admitting: Nurse Practitioner
